# Patient Record
Sex: Male | Born: 1985 | Race: Black or African American | Hispanic: No | Marital: Single | Smoking: Never smoker
Health system: Southern US, Community
[De-identification: ages and names within clinical notes are randomized; demographics above are authoritative.]

## PROBLEM LIST (undated history)

## (undated) DIAGNOSIS — J40 Bronchitis, not specified as acute or chronic: Secondary | ICD-10-CM

## (undated) DIAGNOSIS — R079 Chest pain, unspecified: Secondary | ICD-10-CM

## (undated) DIAGNOSIS — IMO0002 Reserved for concepts with insufficient information to code with codable children: Secondary | ICD-10-CM

## (undated) HISTORY — PX: LEG SURGERY: SHX1003

## (undated) HISTORY — DX: Chest pain, unspecified: R07.9

---
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
## 2005-05-18 ENCOUNTER — Emergency Department (HOSPITAL_COMMUNITY): Admission: EM | Admit: 2005-05-18 | Discharge: 2005-05-18 | Payer: Self-pay | Admitting: *Deleted

## 2007-02-06 ENCOUNTER — Emergency Department (HOSPITAL_COMMUNITY): Admission: EM | Admit: 2007-02-06 | Discharge: 2007-02-06 | Payer: Self-pay | Admitting: Emergency Medicine

## 2007-04-24 ENCOUNTER — Emergency Department (HOSPITAL_COMMUNITY): Admission: EM | Admit: 2007-04-24 | Discharge: 2007-04-24 | Payer: Self-pay | Admitting: Emergency Medicine

## 2008-03-13 ENCOUNTER — Emergency Department (HOSPITAL_COMMUNITY): Admission: EM | Admit: 2008-03-13 | Discharge: 2008-03-13 | Payer: Self-pay | Admitting: Emergency Medicine

## 2009-01-11 ENCOUNTER — Emergency Department (HOSPITAL_COMMUNITY): Admission: EM | Admit: 2009-01-11 | Discharge: 2009-01-11 | Payer: Self-pay | Admitting: Emergency Medicine

## 2010-06-10 ENCOUNTER — Emergency Department (HOSPITAL_COMMUNITY): Admission: EM | Admit: 2010-06-10 | Discharge: 2010-06-10 | Payer: Self-pay | Admitting: Family Medicine

## 2010-07-01 ENCOUNTER — Emergency Department (HOSPITAL_COMMUNITY): Admission: EM | Admit: 2010-07-01 | Discharge: 2010-07-01 | Payer: Self-pay | Admitting: Family Medicine

## 2010-07-14 ENCOUNTER — Encounter
Admission: RE | Admit: 2010-07-14 | Discharge: 2010-08-19 | Payer: Self-pay | Source: Home / Self Care | Attending: Sports Medicine | Admitting: Sports Medicine

## 2010-08-25 ENCOUNTER — Encounter
Admission: RE | Admit: 2010-08-25 | Discharge: 2010-09-16 | Payer: Self-pay | Source: Home / Self Care | Attending: Sports Medicine | Admitting: Sports Medicine

## 2010-08-27 ENCOUNTER — Encounter: Admit: 2010-08-27 | Payer: Self-pay | Admitting: Sports Medicine

## 2010-09-01 ENCOUNTER — Encounter: Admit: 2010-09-01 | Payer: Self-pay | Admitting: Sports Medicine

## 2010-12-02 LAB — POCT RAPID STREP A (OFFICE): Streptococcus, Group A Screen (Direct): NEGATIVE

## 2011-01-09 NOTE — Op Note (Signed)
NAME:  [name], [name] NO.:  [id number]   MEDICAL RECORD NO.:  [id number]          PATIENT TYPE:  AMB   LOCATION:  DSC                          FACILITY:  [facility]   PHYSICIAN:  Doralee Albino. Carola Frost, M.D. DATE OF BIRTH:  03-20-1986   DATE OF PROCEDURE:  06/09/2004  DATE OF DISCHARGE:                                 OPERATIVE REPORT   PREOPERATIVE DIAGNOSIS:  Right leg exertional compartment syndrome.   POSTOPERATIVE DIAGNOSIS:  Right leg exertional compartment syndrome.   OPERATION PERFORMED:  Right leg anterior and lateral compartment releases.   SURGEON:  Doralee Albino. Carola Frost, M.D.   ASSISTANT:  Cecil Cranker, PA   ANESTHESIA:  General.   COMPLICATIONS:  None.   ESTIMATED BLOOD LOSS:  Scant.   TOURNIQUET TIME:  None.   DRAINS:  None.   DISPOSITION:  To PACU condition stable.   INDICATIONS FOR PROCEDURE:  [name] is an 25 year old [place]  college basketball player and student who underwent work-up for right lower  extremity pain with activity and included MRI which was negative for stress  fracture and measurement of compartment pressures after exercise noted  increases up to 56 in the anterior compartment and 47 in the lateral  compartment from a baseline of 15 in both compartments.  This was well  within 30 mmHg of the diastolic pressure.  He was then explained the risks  and benefits of surgery along with his mother and the patient and his mother  elected to proceed with release.   DESCRIPTION OF PROCEDURE:  Paul Yates was taken to the operating room and general  anesthesia induced.  He then had his right lower extremity prepped and  draped in the usual sterile fashion.  A 4 cm incision was then made over the  anterolateral aspect of the leg in the middle between the proximal and  distal fibula.  Dissection was carried down to the fascia and the  intermuscular septum identified.  The perforating vein and subcutaneous  tissue was then  cauterized and cut.  This exposed the anterior compartment  and the lateral compartment.  Longitudinal slits were made in both  compartments and scissors with tips point anteriorly were then used to split  the anterior compartment with pointed anteriorly and the lateral compartment  with the scissor tips pointed posteriorly to avoid any injury to the  superficial peroneal nerve.  Releases were confirmed with digital palpation.  The wound was irrigated and then closed in standard layered fashion with 2-0  Vicryl and 4-0 Monocryl.  Steri-Strips were applied.  The patient was placed  into a gently compressive dressing, awakened from anesthesia and transported  to the post anesthesia care unit in stable condition.   PROGNOSIS:  We anticipate that Mr. Sweet will require some crutches in the  early going as he recovers from his incisional pain and fascial releases.  He will return to the clinic in 10 days for wound inspection and progressive  weightbearing and activities.  Postoperatively we will assess his  superficial peroneal nerve.  He will be provided Percocet for pain control.  [facility]/MEDQ  D:  06/09/2004  T:  06/09/2004  Job:  [id number]

## 2011-03-06 ENCOUNTER — Encounter: Payer: Self-pay | Admitting: *Deleted

## 2011-03-06 ENCOUNTER — Emergency Department (HOSPITAL_BASED_OUTPATIENT_CLINIC_OR_DEPARTMENT_OTHER)
Admission: EM | Admit: 2011-03-06 | Discharge: 2011-03-06 | Disposition: A | Discharge status: Home / Self Care | Payer: Self-pay | Attending: Emergency Medicine | Admitting: Emergency Medicine

## 2011-03-06 DIAGNOSIS — R112 Nausea with vomiting, unspecified: Secondary | ICD-10-CM | POA: Insufficient documentation

## 2011-03-06 DIAGNOSIS — R51 Headache: Secondary | ICD-10-CM | POA: Insufficient documentation

## 2011-03-06 DIAGNOSIS — R197 Diarrhea, unspecified: Secondary | ICD-10-CM | POA: Insufficient documentation

## 2011-03-06 DIAGNOSIS — K5289 Other specified noninfective gastroenteritis and colitis: Secondary | ICD-10-CM | POA: Insufficient documentation

## 2011-03-06 HISTORY — DX: Reserved for concepts with insufficient information to code with codable children: IMO0002

## 2011-03-06 LAB — COMPREHENSIVE METABOLIC PANEL
Albumin: 5.2 g/dL (ref 3.5–5.2)
Alkaline Phosphatase: 72 U/L (ref 39–117)
BUN: 19 mg/dL (ref 6–23)
CO2: 27 mEq/L (ref 19–32)
Chloride: 96 mEq/L (ref 96–112)
Glucose, Bld: 129 mg/dL — ABNORMAL HIGH (ref 70–99)
Potassium: 3.7 mEq/L (ref 3.5–5.1)
Sodium: 139 mEq/L (ref 135–145)
Total Bilirubin: 0.5 mg/dL (ref 0.3–1.2)

## 2011-03-06 LAB — URINALYSIS, ROUTINE W REFLEX MICROSCOPIC
Hgb urine dipstick: NEGATIVE
Ketones, ur: >80 mg/dL — AB
Leukocytes, UA: NEGATIVE
Nitrite: NEGATIVE
Urobilinogen, UA: 0.2 mg/dL (ref 0.0–1.0)

## 2011-03-06 LAB — DIFFERENTIAL
Eosinophils Absolute: 0 10*3/uL (ref 0.0–0.7)
Lymphocytes Relative: 21 % (ref 12–46)
Neutrophils Relative %: 73 % (ref 43–77)

## 2011-03-06 LAB — CBC
HCT: 43.9 % (ref 39.0–52.0)
Hemoglobin: 15.1 g/dL (ref 13.0–17.0)
MCH: 31.1 pg (ref 26.0–34.0)
MCV: 90.5 fL (ref 78.0–100.0)
RBC: 4.85 MIL/uL (ref 4.22–5.81)
RDW: 12.3 % (ref 11.5–15.5)

## 2011-03-06 MED ORDER — DIPHENOXYLATE-ATROPINE 2.5-0.025 MG PO TABS: 2.0000 | ORAL_TABLET | Freq: Four times a day (QID) | ORAL | Status: AC | PRN

## 2011-03-06 MED ORDER — SODIUM CHLORIDE 0.9 % IV SOLN
999.0000 mL | Freq: Once | INTRAVENOUS | Status: AC
  Administered 2011-03-06: 1000 mL via INTRAVENOUS

## 2011-03-06 MED ORDER — OXYCODONE-ACETAMINOPHEN 5-325 MG PO TABS: 1.0000 | ORAL_TABLET | Freq: Four times a day (QID) | ORAL | Status: DC | PRN

## 2011-03-06 MED ORDER — OXYCODONE HCL 10 MG PO TB12: 10.0000 mg | ORAL_TABLET | Freq: Two times a day (BID) | ORAL | Status: DC

## 2011-03-06 MED ORDER — ACETAMINOPHEN 500 MG PO TABS
1000.0000 mg | ORAL_TABLET | Freq: Once | ORAL | Status: AC
  Administered 2011-03-06: 1000 mg via ORAL

## 2011-03-06 MED ORDER — ONDANSETRON HCL 4 MG/2ML IJ SOLN
INTRAMUSCULAR | Status: AC
  Filled 2011-03-06: qty 2

## 2011-03-06 MED ORDER — DIPHENOXYLATE-ATROPINE 2.5-0.025 MG PO TABS
2.0000 | ORAL_TABLET | Freq: Once | ORAL | Status: AC
  Administered 2011-03-06: 2 via ORAL

## 2011-03-06 MED ORDER — ONDANSETRON HCL 4 MG/2ML IJ SOLN
4.0000 mg | Freq: Once | INTRAMUSCULAR | Status: AC
  Administered 2011-03-06: 4 mg via INTRAVENOUS

## 2011-03-06 MED ORDER — ONDANSETRON HCL 8 MG PO TABS: 8.0000 mg | ORAL_TABLET | Freq: Three times a day (TID) | ORAL | Status: AC | PRN

## 2011-03-06 MED ORDER — DIPHENOXYLATE-ATROPINE 2.5-0.025 MG PO TABS
ORAL_TABLET | ORAL | Status: AC
  Filled 2011-03-06: qty 2

## 2011-03-06 MED ORDER — ACETAMINOPHEN 500 MG PO TABS
ORAL_TABLET | ORAL | Status: AC
  Administered 2011-03-06: 1000 mg via ORAL
  Filled 2011-03-06: qty 2

## 2011-03-06 NOTE — ED Provider Notes (Addendum)
History     Chief Complaint  Patient presents with  . Emesis   Patient is a 25 y.o. male presenting with vomiting. The history is provided by the patient.  Emesis  The current episode started 3 to 5 hours ago. The problem has been gradually improving. The emesis has an appearance of stomach contents. Associated symptoms include diarrhea and headaches. Pertinent negatives include no abdominal pain.    Past Medical History  Diagnosis Date  . Bulging disc     Past Surgical History  Procedure Date  . Leg surgery     History reviewed. No pertinent family history.  History  Substance Use Topics  . Smoking status: Never Smoker   . Smokeless tobacco: Not on file  . Alcohol Use: No      Review of Systems  Gastrointestinal: Positive for nausea, vomiting and diarrhea. Negative for heartburn, abdominal pain and constipation.  Neurological: Positive for headaches.  All other systems reviewed and are negative.    Physical Exam  BP 124/71  Pulse 70  Temp(Src) 97.5 F (36.4 C) (Oral)  Resp 16  SpO2 100%  Physical Exam  Nursing note and vitals reviewed. Constitutional: He appears well-developed and well-nourished.  HENT:  Head: Normocephalic and atraumatic.  Eyes: EOM are normal. Pupils are equal, round, and reactive to light.  Neck: Normal range of motion. Neck supple.  Cardiovascular: Normal rate and regular rhythm.   Pulmonary/Chest: Effort normal and breath sounds normal.  Abdominal: Soft. Bowel sounds are normal. He exhibits no distension. There is no tenderness.  Musculoskeletal: Normal range of motion.  Neurological: He is alert.  Skin: Skin is warm and dry.    ED Course  Procedures  MDM  Summary of this visit's results, reviewed by myself:  Results for orders placed during the hospital encounter of 03/06/11  CBC      Component Value Range   WBC 7.4  4.0 - 10.5 (K/uL)   RBC 4.85  4.22 - 5.81 (MIL/uL)   Hemoglobin 15.1  13.0 - 17.0 (g/dL)   HCT 04.5   40.9 - 81.1 (%)   MCV 90.5  78.0 - 100.0 (fL)   MCH 31.1  26.0 - 34.0 (pg)   MCHC 34.4  30.0 - 36.0 (g/dL)   RDW 91.4  78.2 - 95.6 (%)   Platelets 144 (*) 150 - 400 (K/uL)  DIFFERENTIAL      Component Value Range   Neutrophils Relative 73  43 - 77 (%)   Neutro Abs 5.3  1.7 - 7.7 (K/uL)   Lymphocytes Relative 21  12 - 46 (%)   Lymphs Abs 1.6  0.7 - 4.0 (K/uL)   Monocytes Relative 6  3 - 12 (%)   Monocytes Absolute 0.4  0.1 - 1.0 (K/uL)   Eosinophils Relative 0  0 - 5 (%)   Eosinophils Absolute 0.0  0.0 - 0.7 (K/uL)   Basophils Relative 0  0 - 1 (%)   Basophils Absolute 0.0  0.0 - 0.1 (K/uL)  COMPREHENSIVE METABOLIC PANEL      Component Value Range   Sodium 139  135 - 145 (mEq/L)   Potassium 3.7  3.5 - 5.1 (mEq/L)   Chloride 96  96 - 112 (mEq/L)   CO2 27  19 - 32 (mEq/L)   Glucose, Bld 129 (*) 70 - 99 (mg/dL)   BUN 19  6 - 23 (mg/dL)   Creatinine, Ser 2.13  0.50 - 1.35 (mg/dL)   Calcium 08.6 (*) 8.4 - 10.5 (  mg/dL)   Total Protein 8.4 (*) 6.0 - 8.3 (g/dL)   Albumin 5.2  3.5 - 5.2 (g/dL)   AST 51 (*) 0 - 37 (U/L)   ALT 61 (*) 0 - 53 (U/L)   Alkaline Phosphatase 72  39 - 117 (U/L)   Total Bilirubin 0.5  0.3 - 1.2 (mg/dL)   GFR calc non Af Amer >60  >60 (mL/min)   GFR calc Af Amer >60  >60 (mL/min)  URINALYSIS, ROUTINE W REFLEX MICROSCOPIC      Component Value Range   Color, Urine YELLOW  YELLOW    Appearance CLEAR  CLEAR    Specific Gravity, Urine 1.025  1.005 - 1.030    pH 5.5  5.0 - 8.0    Glucose, UA NEGATIVE  NEGATIVE (mg/dL)   Hgb urine dipstick NEGATIVE  NEGATIVE    Bilirubin Urine NEGATIVE  NEGATIVE    Ketones >80 (*) NEGATIVE (mg/dL)   Protein NEGATIVE  NEGATIVE (mg/dL)   Urobilinogen, UA 0.2  0.0 - 1.0 (mg/dL)   Nitrite NEGATIVE  NEGATIVE    Leukocytes, UA NEGATIVE  NEGATIVE    3:29 AM Drinking fluids without emesis; feels better.         Hanley Seamen, MD 03/06/11 0243  Hanley Seamen, MD 03/06/11 4540  Hanley Seamen, MD 03/06/11 [id number]

## 2011-03-06 NOTE — ED Notes (Signed)
Pt c/o N/V/D and HA x1.5 hours.

## 2011-06-05 LAB — RAPID STREP SCREEN (MED CTR MEBANE ONLY): Streptococcus, Group A Screen (Direct): NEGATIVE

## 2011-08-03 ENCOUNTER — Other Ambulatory Visit: Payer: Self-pay | Admitting: Internal Medicine

## 2011-08-03 ENCOUNTER — Ambulatory Visit
Admission: RE | Admit: 2011-08-03 | Discharge: 2011-08-03 | Disposition: A | Discharge status: Home / Self Care | Payer: Self-pay | Source: Ambulatory Visit | Attending: Internal Medicine | Admitting: Internal Medicine

## 2011-08-03 DIAGNOSIS — R52 Pain, unspecified: Secondary | ICD-10-CM

## 2011-09-10 ENCOUNTER — Ambulatory Visit: Payer: Self-pay | Admitting: Internal Medicine

## 2011-09-11 ENCOUNTER — Ambulatory Visit: Payer: Self-pay | Admitting: Internal Medicine

## 2011-09-14 ENCOUNTER — Ambulatory Visit: Payer: Self-pay | Admitting: Internal Medicine

## 2011-10-05 ENCOUNTER — Ambulatory Visit (INDEPENDENT_AMBULATORY_CARE_PROVIDER_SITE_OTHER): Payer: Self-pay | Admitting: Internal Medicine

## 2011-10-05 ENCOUNTER — Encounter: Payer: Self-pay | Admitting: Internal Medicine

## 2011-10-05 VITALS — BP 120/90 | HR 62 | Temp 98.2°F | Ht 71.0 in | Wt 145.0 lb

## 2011-10-05 DIAGNOSIS — M549 Dorsalgia, unspecified: Secondary | ICD-10-CM

## 2011-10-05 MED ORDER — ZOLPIDEM TARTRATE 5 MG PO TABS: 5.0000 mg | ORAL_TABLET | Freq: Every evening | ORAL | Status: DC | PRN

## 2011-10-05 NOTE — Patient Instructions (Signed)
It is important that you exercise regularly, at least 20 minutes 3 to 4 times per week.  If you develop chest pain or shortness of breath seek  medical attention.  Call or return to clinic prn if these symptoms worsen or fail to improve as anticipated.  Continue ibuprofen as directed
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
## 2011-11-19 ENCOUNTER — Encounter: Payer: Self-pay | Admitting: Internal Medicine

## 2011-11-19 ENCOUNTER — Ambulatory Visit (INDEPENDENT_AMBULATORY_CARE_PROVIDER_SITE_OTHER): Payer: Self-pay | Admitting: Internal Medicine

## 2011-11-19 VITALS — BP 120/82 | Wt 148.0 lb

## 2011-11-19 DIAGNOSIS — M549 Dorsalgia, unspecified: Secondary | ICD-10-CM
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
## 2011-11-19 NOTE — Patient Instructions (Addendum)
Physical therapy as discussed  Call or return to clinic prn if these symptoms worsen or fail to improve as anticipated.   Followup Dr. Sheryle Hail if unimproved

## 2011-11-24 ENCOUNTER — Ambulatory Visit: Payer: Self-pay | Attending: Internal Medicine

## 2011-11-24 DIAGNOSIS — M542 Cervicalgia: Secondary | ICD-10-CM | POA: Insufficient documentation

## 2011-11-24 DIAGNOSIS — IMO0001 Reserved for inherently not codable concepts without codable children: Secondary | ICD-10-CM | POA: Insufficient documentation

## 2011-11-24 DIAGNOSIS — M6281 Muscle weakness (generalized): Secondary | ICD-10-CM | POA: Insufficient documentation

## 2011-11-24 DIAGNOSIS — M545 Low back pain, unspecified: Secondary | ICD-10-CM | POA: Insufficient documentation

## 2011-11-27 ENCOUNTER — Ambulatory Visit: Payer: Self-pay

## 2011-11-30 ENCOUNTER — Encounter: Payer: Self-pay | Admitting: Physical Therapy

## 2011-12-04 ENCOUNTER — Ambulatory Visit: Payer: Self-pay | Admitting: Physical Therapy

## 2011-12-07 ENCOUNTER — Ambulatory Visit: Payer: Self-pay | Admitting: Physical Therapy

## 2011-12-11 ENCOUNTER — Other Ambulatory Visit: Payer: Self-pay

## 2011-12-11 ENCOUNTER — Ambulatory Visit: Payer: Self-pay | Admitting: Physical Therapy

## 2011-12-11 MED ORDER — ZOLPIDEM TARTRATE 5 MG PO TABS: 5.0000 mg | ORAL_TABLET | Freq: Every evening | ORAL | Status: DC | PRN

## 2011-12-16 ENCOUNTER — Ambulatory Visit: Payer: Self-pay | Admitting: Physical Therapy

## 2011-12-18 ENCOUNTER — Encounter: Payer: Self-pay | Admitting: Physical Therapy

## 2011-12-21 ENCOUNTER — Encounter: Payer: Self-pay | Admitting: Physical Therapy

## 2011-12-23 ENCOUNTER — Ambulatory Visit: Payer: Self-pay | Attending: Internal Medicine | Admitting: Physical Therapy

## 2011-12-23 DIAGNOSIS — M545 Low back pain, unspecified: Secondary | ICD-10-CM | POA: Insufficient documentation

## 2011-12-23 DIAGNOSIS — M542 Cervicalgia: Secondary | ICD-10-CM | POA: Insufficient documentation

## 2011-12-23 DIAGNOSIS — M6281 Muscle weakness (generalized): Secondary | ICD-10-CM | POA: Insufficient documentation

## 2011-12-23 DIAGNOSIS — IMO0001 Reserved for inherently not codable concepts without codable children: Secondary | ICD-10-CM | POA: Insufficient documentation

## 2011-12-25 ENCOUNTER — Ambulatory Visit: Payer: Self-pay | Admitting: Physical Therapy

## 2011-12-30 ENCOUNTER — Ambulatory Visit: Payer: Self-pay | Admitting: Internal Medicine

## 2011-12-31 ENCOUNTER — Encounter: Payer: Self-pay | Admitting: Internal Medicine

## 2011-12-31 ENCOUNTER — Ambulatory Visit (INDEPENDENT_AMBULATORY_CARE_PROVIDER_SITE_OTHER): Payer: Self-pay | Admitting: Internal Medicine

## 2011-12-31 VITALS — BP 100/64 | Wt 146.0 lb

## 2011-12-31 DIAGNOSIS — S161XXA Strain of muscle, fascia and tendon at neck level, initial encounter: Secondary | ICD-10-CM

## 2011-12-31 DIAGNOSIS — S139XXA Sprain of joints and ligaments of unspecified parts of neck, initial encounter: Secondary | ICD-10-CM

## 2011-12-31 MED ORDER — MELOXICAM 15 MG PO TABS: 15.0000 mg | ORAL_TABLET | Freq: Every day | ORAL | Status: AC

## 2011-12-31 MED ORDER — TRAMADOL HCL 50 MG PO TABS: 50.0000 mg | ORAL_TABLET | ORAL | Status: DC | PRN

## 2011-12-31 MED ORDER — CYCLOBENZAPRINE HCL 10 MG PO TABS: 10.0000 mg | ORAL_TABLET | Freq: Three times a day (TID) | ORAL | Status: DC | PRN
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
## 2011-12-31 NOTE — Patient Instructions (Signed)
Orthopedic evaluation as discussed  Cervical Sprain A cervical sprain is an injury in the neck in which the ligaments are stretched or torn. The ligaments are the tissues that hold the bones of the neck (vertebrae) in place. Cervical sprains can range from very mild to very severe. Most cervical sprains get better in 1 to 3 weeks, but it depends on the cause and extent of the injury. Severe cervical sprains can cause the neck vertebrae to be unstable. This can lead to damage of the spinal cord and can result in serious nervous system problems. Your caregiver will determine whether your cervical sprain is mild or severe. CAUSES   Severe cervical sprains may be caused by:  Contact sport injuries (football, rugby, wrestling, hockey, auto racing, gymnastics, diving, martial arts, boxing).   Motor vehicle collisions.   Whiplash injuries. This means the neck is forcefully whipped backward and forward.   Falls.  Mild cervical sprains may be caused by:    Awkward positions, such as cradling a telephone between your ear and shoulder.   Sitting in a chair that does not offer proper support.   Working at a poorly Marketing executive station.   Activities that require looking up or down for long periods of time.  SYMPTOMS    Pain, soreness, stiffness, or a burning sensation in the front, back, or sides of the neck. This discomfort may develop immediately after injury or it may develop slowly and not begin for 24 hours or more after an injury.   Pain or tenderness directly in the middle of the back of the neck.   Shoulder or upper back pain.   Limited ability to move the neck.   Headache.   Dizziness.   Weakness, numbness, or tingling in the hands or arms.   Muscle spasms.   Difficulty swallowing or chewing.   Tenderness and swelling of the neck.  DIAGNOSIS   Most of the time, your caregiver can diagnose this problem by taking your history and doing a physical exam. Your caregiver will  ask about any known problems, such as arthritis in the neck or a previous neck injury. X-rays may be taken to find out if there are any other problems, such as problems with the bones of the neck. However, an X-ray often does not reveal the full extent of a cervical sprain. Other tests such as a computed tomography (CT) scan or magnetic resonance imaging (MRI) may be needed. TREATMENT   Treatment depends on the severity of the cervical sprain. Mild sprains can be treated with rest, keeping the neck in place (immobilization), and pain medicines. Severe cervical sprains need immediate immobilization and an appointment with an orthopedist or neurosurgeon. Several treatment options are available to help with pain, muscle spasms, and other symptoms. Your caregiver may prescribe:  Medicines, such as pain relievers, numbing medicines, or muscle relaxants.   Physical therapy. This can include stretching exercises, strengthening exercises, and posture training. Exercises and improved posture can help stabilize the neck, strengthen muscles, and help stop symptoms from returning.   A neck collar to be worn for short periods of time. Often, these collars are worn for comfort. However, certain collars may be worn to protect the neck and prevent further worsening of a serious cervical sprain.  HOME CARE INSTRUCTIONS    Put ice on the injured area.   Put ice in a plastic bag.   Place a towel between your skin and the bag.   Leave the ice on for  15 to 20 minutes, 3 to 4 times a day.   Only take over-the-counter or prescription medicines for pain, discomfort, or fever as directed by your caregiver.   Keep all follow-up appointments as directed by your caregiver.   Keep all physical therapy appointments as directed by your caregiver.   If a neck collar is prescribed, wear it as directed by your caregiver.   Do not drive while wearing a neck collar.   Make any needed adjustments to your work station to  promote good posture.   Avoid positions and activities that make your symptoms worse.   Warm up and stretch before being active to help prevent problems.  SEEK MEDICAL CARE IF:    Your pain is not controlled with medicine.   You are unable to decrease your pain medicine over time as planned.   Your activity level is not improving as expected.  SEEK IMMEDIATE MEDICAL CARE IF:    You develop any bleeding, stomach upset, or signs of an allergic reaction to your medicine.   Your symptoms get worse.   You develop new, unexplained symptoms.   You have numbness, tingling, weakness, or paralysis in any part of your body.  MAKE SURE YOU:    Understand these instructions.   Will watch your condition.   Will get help right away if you are not doing well or get worse.  Document Released: 06/07/2007 Document Revised: 07/30/2011 Document Reviewed: 05/13/2011 [facility] Patient Information 2012 [place], [place].Cervical Strain and Sprain (Whiplash) with Rehab Cervical strain and sprains are injuries that commonly occur with "whiplash" injuries. Whiplash occurs when the neck is forcefully whipped backward or forward, such as during a motor vehicle accident. The muscles, ligaments, tendons, discs and nerves of the neck are susceptible to injury when this occurs. SYMPTOMS    Pain or stiffness in the front and/or back of neck   Symptoms may present immediately or up to 24 hours after injury.   Dizziness, headache, nausea and vomiting.   Muscle spasm with soreness and stiffness in the neck.   Tenderness and swelling at the injury site.  CAUSES   Whiplash injuries often occur during contact sports or motor vehicle accidents.   RISK INCREASES WITH:  Osteoarthritis of the spine.   Situations that make head or neck accidents or trauma more likely.   High-risk sports (football, rugby, wrestling, hockey, auto racing, gymnastics, diving, contact karate or boxing).   Poor strength and  flexibility of the neck.   Previous neck injury.   Poor tackling technique.   Improperly fitted or padded equipment.  PREVENTION  Learn and use proper technique (avoid tackling with the head, spearing and head-butting; use proper falling techniques to avoid landing on the head).   Warm up and stretch properly before activity.   Maintain physical fitness:   Strength, flexibility and endurance.   Cardiovascular fitness.   Wear properly fitted and padded protective equipment, such as padded soft collars, for participation in contact sports.  PROGNOSIS   Recovery for cervical strain and sprain injuries is dependent on the extent of the injury. These injuries are usually curable in 1 week to 3 months with appropriate treatment.   RELATED COMPLICATIONS    Temporary numbness and weakness may occur if the nerve roots are damaged, and this may persist until the nerve has completely healed.   Chronic pain due to frequent recurrence of symptoms.   Prolonged healing, especially if activity is resumed too soon (before complete recovery).  TREATMENT  Treatment initially involves the use of ice and medication to help reduce pain and inflammation. It is also important to perform strengthening and stretching exercises and modify activities that worsen symptoms so the injury does not get worse. These exercises may be performed at home or with a therapist. For patients who experience severe symptoms, a soft padded collar may be recommended to be worn around the neck.   Improving your posture may help reduce symptoms. Posture improvement includes pulling your chin and abdomen in while sitting or standing. If you are sitting, sit in a firm chair with your buttocks against the back of the chair. While sleeping, try replacing your pillow with a small towel rolled to 2 inches in diameter, or use a cervical pillow or soft cervical collar. Poor sleeping positions delay healing.   For patients with nerve root  damage, which causes numbness or weakness, the use of a cervical traction apparatus may be recommended. Surgery is rarely necessary for these injuries. However, cervical strain and sprains that are present at birth (congenital) may require surgery. MEDICATION    If pain medication is necessary, nonsteroidal anti-inflammatory medications, such as aspirin and ibuprofen, or other minor pain relievers, such as acetaminophen, are often recommended.   Do not take pain medication for 7 days before surgery.   Prescription pain relievers may be given if deemed necessary by your caregiver. Use only as directed and only as much as you need.  HEAT AND COLD:    Cold treatment (icing) relieves pain and reduces inflammation. Cold treatment should be applied for 10 to 15 minutes every 2 to 3 hours for inflammation and pain and immediately after any activity that aggravates your symptoms. Use ice packs or an ice massage.   Heat treatment may be used prior to performing the stretching and strengthening activities prescribed by your caregiver, physical therapist, or athletic trainer. Use a heat pack or a warm soak.  SEEK MEDICAL CARE IF:    Symptoms get worse or do not improve in 2 weeks despite treatment.   New, unexplained symptoms develop (drugs used in treatment may produce side effects).  EXERCISES RANGE OF MOTION (ROM) AND STRETCHING EXERCISES - Cervical Strain and Sprain These exercises may help you when beginning to rehabilitate your injury. In order to successfully resolve your symptoms, you must improve your posture. These exercises are designed to help reduce the forward-head and rounded-shoulder posture which contributes to this condition. Your symptoms may resolve with or without further involvement from your physician, physical therapist or athletic trainer. While completing these exercises, remember:    Restoring tissue flexibility helps normal motion to return to the joints. This allows healthier,  less painful movement and activity.   An effective stretch should be held for at least 20 seconds, although you may need to begin with shorter hold times for comfort.   A stretch should never be painful. You should only feel a gentle lengthening or release in the stretched tissue.  STRETCH- Axial Extensors  Lie on your back on the floor. You may bend your knees for comfort. Place a rolled up hand towel or dish towel, about 2 inches in diameter, under the part of your head that makes contact with the floor.   Gently tuck your chin, as if trying to make a "double chin," until you feel a gentle stretch at the base of your head.   Hold __________ seconds.  Repeat __________ times. Complete this exercise __________ times per day.   STRETECH -  Axial Extension   Stand or sit on a firm surface. Assume a good posture: chest up, shoulders drawn back, abdominal muscles slightly tense, knees unlocked (if standing) and feet hip width apart.   Slowly retract your chin so your head slides back and your chin slightly lowers.Continue to look straight ahead.   You should feel a gentle stretch in the back of your head. Be certain not to feel an aggressive stretch since this can cause headaches later.   Hold for __________ seconds.  Repeat __________ times. Complete this exercise __________ times per day. STRETCH - Cervical Side Bend   Stand or sit on a firm surface. Assume a good posture: chest up, shoulders drawn back, abdominal muscles slightly tense, knees unlocked (if standing) and feet hip width apart.   Without letting your nose or shoulders move, slowly tip your right / left ear to your shoulder until your feel a gentle stretch in the muscles on the opposite side of your neck.   Hold __________ seconds.  Repeat __________ times. Complete this exercise __________ times per day. STRETCH - Cervical Rotators   Stand or sit on a firm surface. Assume a good posture: chest up, shoulders drawn back,  abdominal muscles slightly tense, knees unlocked (if standing) and feet hip width apart.   Keeping your eyes level with the ground, slowly turn your head until you feel a gentle stretch along the back and opposite side of your neck.   Hold __________ seconds.  Repeat __________ times. Complete this exercise __________ times per day. RANGE OF MOTION - Neck Circles   Stand or sit on a firm surface. Assume a good posture: chest up, shoulders drawn back, abdominal muscles slightly tense, knees unlocked (if standing) and feet hip width apart.   Gently roll your head down and around from the back of one shoulder to the back of the other. The motion should never be forced or painful.   Repeat the motion 10-20 times, or until you feel the neck muscles relax and loosen.  Repeat __________ times. Complete the exercise __________ times per day. STRENGTHENING EXERCISES - Cervical Strain and Sprain These exercises may help you when beginning to rehabilitate your injury. They may resolve your symptoms with or without further involvement from your physician, physical therapist or athletic trainer. While completing these exercises, remember:    Muscles can gain both the endurance and the strength needed for everyday activities through controlled exercises.   Complete these exercises as instructed by your physician, physical therapist or athletic trainer. Progress the resistance and repetitions only as guided.   You may experience muscle soreness or fatigue, but the pain or discomfort you are trying to eliminate should never worsen during these exercises. If this pain does worsen, stop and make certain you are following the directions exactly. If the pain is still present after adjustments, discontinue the exercise until you can discuss the trouble with your clinician.  STRENGTH - Cervical Flexors, Isometric  Face a wall, standing about 6 inches away. Place a small pillow, a ball about 6-8 inches in diameter,  or a folded towel between your forehead and the wall.   Slightly tuck your chin and gently push your forehead into the soft object. Push only with mild to moderate intensity, building up tension gradually. Keep your jaw and forehead relaxed.   Hold 10 to 20 seconds. Keep your breathing relaxed.   Release the tension slowly. Relax your neck muscles completely before you start the next repetition.  Repeat __________ times. Complete this exercise __________ times per day. STRENGTH- Cervical Lateral Flexors, Isometric   Stand about 6 inches away from a wall. Place a small pillow, a ball about 6-8 inches in diameter, or a folded towel between the side of your head and the wall.   Slightly tuck your chin and gently tilt your head into the soft object. Push only with mild to moderate intensity, building up tension gradually. Keep your jaw and forehead relaxed.   Hold 10 to 20 seconds. Keep your breathing relaxed.   Release the tension slowly. Relax your neck muscles completely before you start the next repetition.  Repeat __________ times. Complete this exercise __________ times per day. STRENGTH - Cervical Extensors, Isometric   Stand about 6 inches away from a wall. Place a small pillow, a ball about 6-8 inches in diameter, or a folded towel between the back of your head and the wall.   Slightly tuck your chin and gently tilt your head back into the soft object. Push only with mild to moderate intensity, building up tension gradually. Keep your jaw and forehead relaxed.   Hold 10 to 20 seconds. Keep your breathing relaxed.   Release the tension slowly. Relax your neck muscles completely before you start the next repetition.  Repeat __________ times. Complete this exercise __________ times per day. POSTURE AND BODY MECHANICS CONSIDERATIONS - Cervical Strain and Sprain Keeping correct posture when sitting, standing or completing your activities will reduce the stress put on different body  tissues, allowing injured tissues a chance to heal and limiting painful experiences. The following are general guidelines for improved posture. Your physician or physical therapist will provide you with any instructions specific to your needs. While reading these guidelines, remember:  The exercises prescribed by your provider will help you have the flexibility and strength to maintain correct postures.   The correct posture provides the optimal environment for your joints to work. All of your joints have less wear and tear when properly supported by a spine with good posture. This means you will experience a healthier, less painful body.   Correct posture must be practiced with all of your activities, especially prolonged sitting and standing. Correct posture is as important when doing repetitive low-stress activities (typing) as it is when doing a single heavy-load activity (lifting).  PROLONGED STANDING WHILE SLIGHTLY LEANING FORWARD When completing a task that requires you to lean forward while standing in one place for a long time, place either foot up on a stationary 2-4 inch high object to help maintain the best posture. When both feet are on the ground, the low back tends to lose its slight inward curve. If this curve flattens (or becomes too large), then the back and your other joints will experience too much stress, fatigue more quickly and can cause pain.   RESTING POSITIONS Consider which positions are most painful for you when choosing a resting position. If you have pain with flexion-based activities (sitting, bending, stooping, squatting), choose a position that allows you to rest in a less flexed posture. You would want to avoid curling into a fetal position on your side. If your pain worsens with extension-based activities (prolonged standing, working overhead), avoid resting in an extended position such as sleeping on your stomach. Most people will find more comfort when they rest with  their spine in a more neutral position, neither too rounded nor too arched. Lying on a non-sagging bed on your side with a pillow between your  knees, or on your back with a pillow under your knees will often provide some relief. Keep in mind, being in any one position for a prolonged period of time, no matter how correct your posture, can still lead to stiffness. WALKING Walk with an upright posture. Your ears, shoulders and hips should all line-up. OFFICE WORK When working at a desk, create an environment that supports good, upright posture. Without extra support, muscles fatigue and lead to excessive strain on joints and other tissues. CHAIR:  A chair should be able to slide under your desk when your back makes contact with the back of the chair. This allows you to work closely.   The chair's height should allow your eyes to be level with the upper part of your monitor and your hands to be slightly lower than your elbows.   Body position:   Your feet should make contact with the floor. If this is not possible, use a foot rest.   Keep your ears over your shoulders. This will reduce stress on your neck and low back.  Document Released: 08/10/2005 Document Revised: 07/30/2011 Document Reviewed: 11/22/2008 [facility] Patient Information 2012 [place], [place].

## 2012-01-01 ENCOUNTER — Telehealth: Payer: Self-pay | Admitting: Internal Medicine

## 2012-01-01 NOTE — Telephone Encounter (Signed)
Patient is requesting a note for work, as he was in on yesterday 12/31/11. Please assist.

## 2012-01-01 NOTE — Telephone Encounter (Signed)
Attempt to call - Vm - LMTCB if questions - note will be done and put up front for pick up . KIK

## 2012-01-15 ENCOUNTER — Other Ambulatory Visit: Payer: Self-pay

## 2012-01-15 MED ORDER — ZOLPIDEM TARTRATE 5 MG PO TABS: 5.0000 mg | ORAL_TABLET | Freq: Every evening | ORAL | Status: DC | PRN

## 2012-02-04 ENCOUNTER — Other Ambulatory Visit: Payer: Self-pay | Admitting: Orthopedic Surgery

## 2012-02-04 DIAGNOSIS — M542 Cervicalgia: Secondary | ICD-10-CM

## 2012-02-04 DIAGNOSIS — M545 Low back pain: Secondary | ICD-10-CM

## 2012-02-08 ENCOUNTER — Other Ambulatory Visit: Payer: Self-pay

## 2012-02-10 ENCOUNTER — Ambulatory Visit
Admission: RE | Admit: 2012-02-10 | Discharge: 2012-02-10 | Disposition: A | Discharge status: Home / Self Care | Payer: Self-pay | Source: Ambulatory Visit | Attending: Orthopedic Surgery | Admitting: Orthopedic Surgery

## 2012-02-10 DIAGNOSIS — M545 Low back pain: Secondary | ICD-10-CM

## 2012-02-10 DIAGNOSIS — M542 Cervicalgia: Secondary | ICD-10-CM

## 2012-03-21 ENCOUNTER — Ambulatory Visit (INDEPENDENT_AMBULATORY_CARE_PROVIDER_SITE_OTHER): Payer: Self-pay | Admitting: Internal Medicine

## 2012-03-21 ENCOUNTER — Encounter: Payer: Self-pay | Admitting: Internal Medicine

## 2012-03-21 VITALS — BP 110/70 | Wt 150.0 lb

## 2012-03-21 DIAGNOSIS — M549 Dorsalgia, unspecified: Secondary | ICD-10-CM

## 2012-03-21 MED ORDER — ZOLPIDEM TARTRATE 5 MG PO TABS: 5.0000 mg | ORAL_TABLET | Freq: Every evening | ORAL | Status: DC | PRN

## 2012-03-21 NOTE — Patient Instructions (Signed)
Orthopedic followup as scheduled  Return here as needed

## 2012-03-21 NOTE — Progress Notes (Signed)
  Subjective:    Patient ID: Paul Yates, male    DOB: 13-Jul-1986, 26 y.o.   MRN: [id number]  HPI  26 year old patient who is followed by orthopedics due to cervical and low back pain. Unfortunately he is about the same. He is considering further physical therapy and epidurals in the near future. His job unfortunately  consists of a 12 hour shifts.  He is using tramadol Mobic and occasional Flexeril.    Review of Systems  Musculoskeletal: Positive for back pain.       Objective:   Physical Exam  Constitutional: He appears well-developed and well-nourished. No distress.  Musculoskeletal:       Exam is unchanged straight leg test on the right does cause pain to the right hip and right thigh region  No motor weakness. Able to walk on his toes and heels  Absent right Achilles reflex unchanged          Assessment & Plan:   Cervical and lumbar pain. Followup orthopedics Medicines refilled;  return here when necessary

## 2012-03-30 ENCOUNTER — Telehealth: Payer: Self-pay | Admitting: Internal Medicine

## 2012-03-30 MED ORDER — HYDROCODONE-ACETAMINOPHEN 5-500 MG PO TABS: 1.0000 | ORAL_TABLET | Freq: Four times a day (QID) | ORAL | Status: AC | PRN

## 2012-03-30 NOTE — Telephone Encounter (Signed)
vicodin [phone]  #[phone]  One every 6 hrs prn; f/u ortho

## 2012-03-30 NOTE — Telephone Encounter (Signed)
Caller: Angela/Mother; PCP: Eleonore Chiquito; CB#: [phone]; ; ; Call regarding Back Pain; Mother reports that an MRI was done and did show some nerve damage. Patient was instructed to call back if pain is still present and Dr. Amador Cunas would call in pain medication. Patient has been taking Ambien for sleeping and Tramadol for pain. Mother reports that the Tramadol is not helping the pain. Patient reports pain across the back of shoulders and lower back. Mother reports that the patient is having numbness that is radiating down his left leg at this time. Patient reports the pain in the shoulder blades is a burning pain. Emergent symptom of "Back pain radiates into thigh or below knee" positive per Back Symptoms guideline. Mother reports that an orthopedic physician prescribed the pain medication to the patient. Attempted to make an appointment with the patient's primary doctor, but there were no appointments available today. Instructed the mother of the patient to call the orthopedic doctor to see about getting further treatment. Instructed mother to call back if orthopedic doctor not able to give assistance.

## 2012-03-30 NOTE — Telephone Encounter (Signed)
Mother aware - she is still trying to get appt with ortho - rx called to [facility]

## 2012-03-30 NOTE — Telephone Encounter (Signed)
Forward to inform 

## 2012-04-28 ENCOUNTER — Other Ambulatory Visit (HOSPITAL_COMMUNITY): Payer: Self-pay | Admitting: Orthopedic Surgery

## 2012-04-28 DIAGNOSIS — M542 Cervicalgia: Secondary | ICD-10-CM

## 2012-05-02 ENCOUNTER — Other Ambulatory Visit (HOSPITAL_COMMUNITY): Payer: Self-pay

## 2012-05-13 ENCOUNTER — Other Ambulatory Visit (HOSPITAL_COMMUNITY): Payer: Self-pay

## 2012-06-09 ENCOUNTER — Other Ambulatory Visit: Payer: Self-pay | Admitting: Internal Medicine

## 2012-07-15 ENCOUNTER — Other Ambulatory Visit: Payer: Self-pay | Admitting: Internal Medicine

## 2012-10-03 ENCOUNTER — Other Ambulatory Visit: Payer: Self-pay | Admitting: Internal Medicine

## 2012-12-01 ENCOUNTER — Other Ambulatory Visit: Payer: Self-pay | Admitting: Internal Medicine

## 2013-01-29 ENCOUNTER — Other Ambulatory Visit: Payer: Self-pay | Admitting: Internal Medicine

## 2013-01-31 NOTE — Telephone Encounter (Signed)
Pt 's mother following up on request for script zolpidem (AMBIEN) 5 MG tablet [facility] [address]

## 2013-03-29 ENCOUNTER — Other Ambulatory Visit: Payer: Self-pay | Admitting: Internal Medicine

## 2013-03-30 NOTE — Telephone Encounter (Signed)
Pt following up on rx request, is in a lot of back pain, no meds to take.

## 2013-03-31 ENCOUNTER — Other Ambulatory Visit: Payer: Self-pay | Admitting: Internal Medicine

## 2013-05-01 ENCOUNTER — Other Ambulatory Visit: Payer: Self-pay | Admitting: Internal Medicine

## 2013-05-04 ENCOUNTER — Other Ambulatory Visit: Payer: Self-pay | Admitting: Internal Medicine

## 2013-05-04 ENCOUNTER — Telehealth: Payer: Self-pay | Admitting: Internal Medicine

## 2013-05-04 MED ORDER — ZOLPIDEM TARTRATE 5 MG PO TABS: ORAL_TABLET | ORAL | Status: DC

## 2013-05-04 MED ORDER — TRAMADOL HCL 50 MG PO TABS: 50.0000 mg | ORAL_TABLET | Freq: Three times a day (TID) | ORAL | Status: DC | PRN

## 2013-05-04 NOTE — Telephone Encounter (Signed)
Spoke to pt's mother Marylene Land told her Rx's called into pharmacy. Marylene Land verbalized understanding and stated pt will make an appt with in the next 3 months has just started a new job and is waiting for insurance. Told her okay.

## 2013-05-04 NOTE — Telephone Encounter (Signed)
Mother called for pt's refill.  Tramadol Paul Yates (advised mother should be one refill on [place].)   He is living in [place] b/c he took a job @ [employer]. Pt works 8-5pm and doesn't feel like he can ask off work for at least 90 days more days.. Only been there 3 weeks. Pt does not have insurance either until 90 days. Mother would like to ask if we could refill for 90 days. Then [facility] appt [facility]  pt not seen since 03/21/12

## 2013-10-05 ENCOUNTER — Emergency Department (INDEPENDENT_AMBULATORY_CARE_PROVIDER_SITE_OTHER)
Admission: EM | Admit: 2013-10-05 | Discharge: 2013-10-05 | Disposition: A | Discharge status: Home / Self Care | Payer: Self-pay | Source: Home / Self Care

## 2013-10-05 ENCOUNTER — Encounter (HOSPITAL_COMMUNITY): Payer: Self-pay | Admitting: Emergency Medicine

## 2013-10-05 DIAGNOSIS — G44209 Tension-type headache, unspecified, not intractable: Secondary | ICD-10-CM

## 2013-10-05 MED ORDER — KETOROLAC TROMETHAMINE 60 MG/2ML IM SOLN
INTRAMUSCULAR | Status: AC
  Filled 2013-10-05: qty 2

## 2013-10-05 MED ORDER — BUTALBITAL-APAP-CAFFEINE 50-325-40 MG PO TABS: 1.0000 | ORAL_TABLET | Freq: Four times a day (QID) | ORAL | Status: DC | PRN

## 2013-10-05 MED ORDER — TRAMADOL HCL 50 MG PO TABS: 50.0000 mg | ORAL_TABLET | Freq: Four times a day (QID) | ORAL | Status: DC | PRN

## 2013-10-05 MED ORDER — DICLOFENAC POTASSIUM 50 MG PO TABS: 50.0000 mg | ORAL_TABLET | Freq: Three times a day (TID) | ORAL | Status: DC

## 2013-10-05 MED ORDER — KETOROLAC TROMETHAMINE 60 MG/2ML IM SOLN
60.0000 mg | Freq: Once | INTRAMUSCULAR | Status: AC
  Administered 2013-10-05: 60 mg via INTRAMUSCULAR

## 2013-10-05 NOTE — ED Notes (Signed)
C/o headache that started 6 days ago. Stated it is progressively getting worse. Pain is 9/10. Stated that the pain radiates down to the back of his neck and making his stomach hurt. Has taken Aleve this morning with no relief. Written by: Marga MelnickQuaNeisha Jones, SMA

## 2013-10-05 NOTE — Discharge Instructions (Signed)
Tension Headache A tension headache is a feeling of pain, pressure, or aching often felt over the front and sides of the head. The pain can be dull or can feel tight (constricting). It is the most common type of headache. Tension headaches are not normally associated with nausea or vomiting and do not get worse with physical activity. Tension headaches can last 30 minutes to several days.  CAUSES  The exact cause is not known, but it may be caused by chemicals and hormones in the brain that lead to pain. Tension headaches often begin after stress, anxiety, or depression. Other triggers may include:  Alcohol.  Caffeine (too much or withdrawal).  Respiratory infections (colds, flu, sinus infections).  Dental problems or teeth clenching.  Fatigue.  Holding your head and neck in one position too long while using a computer. SYMPTOMS   Pressure around the head.   Dull, aching head pain.   Pain felt over the front and sides of the head.   Tenderness in the muscles of the head, neck, and shoulders. DIAGNOSIS  A tension headache is often diagnosed based on:   Symptoms.   Physical examination.   A CT scan or MRI of your head. These tests may be ordered if symptoms are severe or unusual. TREATMENT  Medicines may be given to help relieve symptoms.  HOME CARE INSTRUCTIONS   Only take over-the-counter or prescription medicines for pain or discomfort as directed by your caregiver.   Lie down in a dark, quiet room when you have a headache.   Keep a journal to find out what may be triggering your headaches. For example, write down:  What you eat and drink.  How much sleep you get.  Any change to your diet or medicines.  Try massage or other relaxation techniques.   Ice packs or heat applied to the head and neck can be used. Use these 3 to 4 times per day for 15 to 20 minutes each time, or as needed.   Limit stress.   Sit up straight, and do not tense your muscles.    Quit smoking if you smoke.  Limit alcohol use.  Decrease the amount of caffeine you drink, or stop drinking caffeine.  Eat and exercise regularly.  Get 7 to 9 hours of sleep, or as recommended by your caregiver.  Avoid excessive use of pain medicine as recurrent headaches can occur.  SEEK MEDICAL CARE IF:   You have problems with the medicines you were prescribed.  Your medicines do not work.  You have a change from the usual headache.  You have nausea or vomiting. SEEK IMMEDIATE MEDICAL CARE IF:   Your headache becomes severe.  You have a fever.  You have a stiff neck.  You have loss of vision.  You have muscular weakness or loss of muscle control.  You lose your balance or have trouble walking.  You feel faint or pass out.  You have severe symptoms that are different from your first symptoms. MAKE SURE YOU:   Understand these instructions.  Will watch your condition.  Will get help right away if you are not doing well or get worse. Document Released: 08/10/2005 Document Revised: 11/02/2011 Document Reviewed: 07/31/2011 [facility] Patient Information 2014 [place], [place].  Headaches, Frequently Asked Questions MIGRAINE HEADACHES Q: What is migraine? What causes it? How can I treat it? A: Generally, migraine headaches begin as a dull ache. Then they develop into a constant, throbbing, and pulsating pain. You may experience pain  at the temples. You may experience pain at the front or back of one or both sides of the head. The pain is usually accompanied by a combination of:  Nausea.  Vomiting.  Sensitivity to light and noise. Some people (about 15%) experience an aura (see below) before an attack. The cause of migraine is believed to be chemical reactions in the brain. Treatment for migraine may include over-the-counter or prescription medications. It may also include self-help techniques. These include relaxation training and biofeedback.  Q: What is an  aura? A: About 15% of people with migraine get an "aura". This is a sign of neurological symptoms that occur before a migraine headache. You may see wavy or jagged lines, dots, or flashing lights. You might experience tunnel vision or blind spots in one or both eyes. The aura can include visual or auditory hallucinations (something imagined). It may include disruptions in smell (such as strange odors), taste or touch. Other symptoms include:  Numbness.  A "pins and needles" sensation.  Difficulty in recalling or speaking the correct word. These neurological events may last as long as 60 minutes. These symptoms will fade as the headache begins. Q: What is a trigger? A: Certain physical or environmental factors can lead to or "trigger" a migraine. These include:  Foods.  Hormonal changes.  Weather.  Stress. It is important to remember that triggers are different for everyone. To help prevent migraine attacks, you need to figure out which triggers affect you. Keep a headache diary. This is a good way to track triggers. The diary will help you talk to your healthcare professional about your condition. Q: Does weather affect migraines? A: Bright sunshine, hot, humid conditions, and drastic changes in barometric pressure may lead to, or "trigger," a migraine attack in some people. But studies have shown that weather does not act as a trigger for everyone with migraines. Q: What is the link between migraine and hormones? A: Hormones start and regulate many of your body's functions. Hormones keep your body in balance within a constantly changing environment. The levels of hormones in your body are unbalanced at times. Examples are during menstruation, pregnancy, or menopause. That can lead to a migraine attack. In fact, about three quarters of all women with migraine report that their attacks are related to the menstrual cycle.  Q: Is there an increased risk of stroke for migraine sufferers? A: The  likelihood of a migraine attack causing a stroke is very remote. That is not to say that migraine sufferers cannot have a stroke associated with their migraines. In persons under age 28, the most common associated factor for stroke is migraine headache. But over the course of a person's normal life span, the occurrence of migraine headache may actually be associated with a reduced risk of dying from cerebrovascular disease due to stroke.  Q: What are acute medications for migraine? A: Acute medications are used to treat the pain of the headache after it has started. Examples over-the-counter medications, NSAIDs, ergots, and triptans.  Q: What are the triptans? A: Triptans are the newest class of abortive medications. They are specifically targeted to treat migraine. Triptans are vasoconstrictors. They moderate some chemical reactions in the brain. The triptans work on receptors in your brain. Triptans help to restore the balance of a neurotransmitter called serotonin. Fluctuations in levels of serotonin are thought to be a main cause of migraine.  Q: Are over-the-counter medications for migraine effective? A: Over-the-counter, or "OTC," medications may be effective in  relieving mild to moderate pain and associated symptoms of migraine. But you should see your caregiver before beginning any treatment regimen for migraine.  Q: What are preventive medications for migraine? A: Preventive medications for migraine are sometimes referred to as "prophylactic" treatments. They are used to reduce the frequency, severity, and length of migraine attacks. Examples of preventive medications include antiepileptic medications, antidepressants, beta-blockers, calcium channel blockers, and NSAIDs (nonsteroidal anti-inflammatory drugs). Q: Why are anticonvulsants used to treat migraine? A: During the past few years, there has been an increased interest in antiepileptic drugs for the prevention of migraine. They are sometimes  referred to as "anticonvulsants". Both epilepsy and migraine may be caused by similar reactions in the brain.  Q: Why are antidepressants used to treat migraine? A: Antidepressants are typically used to treat people with depression. They may reduce migraine frequency by regulating chemical levels, such as serotonin, in the brain.  Q: What alternative therapies are used to treat migraine? A: The term "alternative therapies" is often used to describe treatments considered outside the scope of conventional Western medicine. Examples of alternative therapy include acupuncture, acupressure, and yoga. Another common alternative treatment is herbal therapy. Some herbs are believed to relieve headache pain. Always discuss alternative therapies with your caregiver before proceeding. Some herbal products contain arsenic and other toxins. TENSION HEADACHES Q: What is a tension-type headache? What causes it? How can I treat it? A: Tension-type headaches occur randomly. They are often the result of temporary stress, anxiety, fatigue, or anger. Symptoms include soreness in your temples, a tightening band-like sensation around your head (a "vice-like" ache). Symptoms can also include a pulling feeling, pressure sensations, and contracting head and neck muscles. The headache begins in your forehead, temples, or the back of your head and neck. Treatment for tension-type headache may include over-the-counter or prescription medications. Treatment may also include self-help techniques such as relaxation training and biofeedback. CLUSTER HEADACHES Q: What is a cluster headache? What causes it? How can I treat it? A: Cluster headache gets its name because the attacks come in groups. The pain arrives with little, if any, warning. It is usually on one side of the head. A tearing or bloodshot eye and a runny nose on the same side of the headache may also accompany the pain. Cluster headaches are believed to be caused by chemical  reactions in the brain. They have been described as the most severe and intense of any headache type. Treatment for cluster headache includes prescription medication and oxygen. SINUS HEADACHES Q: What is a sinus headache? What causes it? How can I treat it? A: When a cavity in the bones of the face and skull (a sinus) becomes inflamed, the inflammation will cause localized pain. This condition is usually the result of an allergic reaction, a tumor, or an infection. If your headache is caused by a sinus blockage, such as an infection, you will probably have a fever. An x-ray will confirm a sinus blockage. Your caregiver's treatment might include antibiotics for the infection, as well as antihistamines or decongestants.  REBOUND HEADACHES Q: What is a rebound headache? What causes it? How can I treat it? A: A pattern of taking acute headache medications too often can lead to a condition known as "rebound headache." A pattern of taking too much headache medication includes taking it more than 2 days per week or in excessive amounts. That means more than the label or a caregiver advises. With rebound headaches, your medications not only stop relieving  pain, they actually begin to cause headaches. Doctors treat rebound headache by tapering the medication that is being overused. Sometimes your caregiver will gradually substitute a different type of treatment or medication. Stopping may be a challenge. Regularly overusing a medication increases the potential for serious side effects. Consult a caregiver if you regularly use headache medications more than 2 days per week or more than the label advises. ADDITIONAL QUESTIONS AND ANSWERS Q: What is biofeedback? A: Biofeedback is a self-help treatment. Biofeedback uses special equipment to monitor your body's involuntary physical responses. Biofeedback monitors:  Breathing.  Pulse.  Heart rate.  Temperature.  Muscle tension.  Brain activity. Biofeedback  helps you refine and perfect your relaxation exercises. You learn to control the physical responses that are related to stress. Once the technique has been mastered, you do not need the equipment any more. Q: Are headaches hereditary? A: Four out of five (80%) of people that suffer report a family history of migraine. Scientists are not sure if this is genetic or a family predisposition. Despite the uncertainty, a child has a 50% chance of having migraine if one parent suffers. The child has a 75% chance if both parents suffer.  Q: Can children get headaches? A: By the time they reach high school, most young people have experienced some type of headache. Many safe and effective approaches or medications can prevent a headache from occurring or stop it after it has begun.  Q: What type of doctor should I see to diagnose and treat my headache? A: Start with your primary caregiver. Discuss his or her experience and approach to headaches. Discuss methods of classification, diagnosis, and treatment. Your caregiver may decide to recommend you to a headache specialist, depending upon your symptoms or other physical conditions. Having diabetes, allergies, etc., may require a more comprehensive and inclusive approach to your headache. The National Head[employer] Foundation will provide, upon request, a list of [facility] physician members in your state. Document Released: 10/31/2003 Document Revised: 11/02/2011 Document Reviewed: 04/09/2008 [facility] Patient Information 2014 [place], [place].

## 2013-10-05 NOTE — ED Provider Notes (Signed)
CSN: [id number]     Arrival date & time 10/05/13  1154 History   First MD Initiated Contact with Patient 10/05/13 1232     Chief Complaint  Patient presents with  . Migraine     (Consider location/radiation/quality/duration/timing/severity/associated sxs/prior Treatment) HPI Comments: 28 year old male complaining of a headache for one week. States he has had a history of migraine headaches but has not had one in years. He states that the headache is located around both eyes as well as in the upper neck and occiput. Associated symptoms includes photophobia. He denies problems with vision speech hearing or swallowing, nausea, vomiting or diarrhea. Denies focal weakness or paresthesias. He states taking a hot shower helps his headaches but they come right back. 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 This stance in body posture to maintain for several hours a day.   Past Medical History  Diagnosis Date  . Bulging disc    Past Surgical History  Procedure Laterality Date  . Leg surgery     History reviewed. No pertinent family history. History  Substance Use Topics  . Smoking status: Never Smoker   . Smokeless tobacco: Never Used  . Alcohol Use: No    Review of Systems  Constitutional: Positive for activity change. Negative for fever.  Eyes: Positive for photophobia. Negative for visual disturbance.  Respiratory: Negative.   Cardiovascular: Negative.   Gastrointestinal: Negative.   Genitourinary: Negative.   Musculoskeletal: Positive for neck pain.  Neurological: Positive for light-headedness and headaches. Negative for tremors, syncope, facial asymmetry and speech difficulty.      Allergies  Review of patient's allergies indicates no known allergies.  Home Medications   Current Outpatient Rx  Name  Route  Sig  Dispense  Refill  . ibuprofen (ADVIL,MOTRIN) 800 MG tablet   Oral   Take 800 mg by mouth every 8  (eight) hours as needed.         . butalbital-acetaminophen-caffeine (FIORICET) 50-325-40 MG per tablet   Oral   Take 1-2 tablets by mouth every 6 (six) hours as needed for headache.   6 tablet   0   . cyclobenzaprine (FLEXERIL) 10 MG tablet   Oral   Take 1 tablet (10 mg total) by mouth 3 (three) times daily as needed.   30 tablet   3   . diclofenac (CATAFLAM) 50 MG tablet   Oral   Take 1 tablet (50 mg total) by mouth 3 (three) times daily.   21 tablet   0   . ibuprofen (ADVIL,MOTRIN) 200 MG tablet   Oral   Take 200 mg by mouth every 6 (six) hours as needed.         . traMADol (ULTRAM) 50 MG tablet   Oral   Take 1 tablet (50 mg total) by mouth every 8 (eight) hours as needed for pain.   60 tablet   2   . traMADol (ULTRAM) 50 MG tablet   Oral   Take 1 tablet (50 mg total) by mouth every 6 (six) hours as needed.   15 tablet   0   . zolpidem (AMBIEN) 5 MG tablet      TAKE 1 TABLET AT BEDTIME AS NEEDED   30 tablet   2    BP 114/72  Pulse 58  Temp(Src) 98.1 F (36.7 C) (Oral)  Resp 18  SpO2 98% Physical Exam  Nursing note and vitals reviewed. Constitutional: He is oriented  to person, place, and time. He appears well-developed and well-nourished. No distress.  HENT:  Head: Normocephalic and atraumatic.  Mouth/Throat: Oropharynx is clear and moist. No oropharyngeal exudate.  Soft palate rises symmetrically, normal swallowing reflex  Eyes: Conjunctivae and EOM are normal. Pupils are equal, round, and reactive to light.  Neck:  Range of motion is normal however slow due to pain in the neck musculature. There is tenderness in the posterior lateral neck musculature that tracks up to the scalp.  Cardiovascular: Normal rate, regular rhythm and normal heart sounds.   Pulmonary/Chest: Effort normal and breath sounds normal. No respiratory distress. He has no wheezes. He has no rales.  Abdominal: Soft.  Musculoskeletal: He exhibits no edema.  Lymphadenopathy:    He  has no cervical adenopathy.  Neurological: He is alert and oriented to person, place, and time. He has normal strength. No cranial nerve deficit or sensory deficit. He exhibits normal muscle tone. He displays a negative Romberg sign. Coordination and gait normal. GCS eye subscore is 4. GCS verbal subscore is 5. GCS motor subscore is 6.  Toe ascension nl Balance nl No abnl neuro findings.  Skin: Skin is warm and dry. No rash noted.  Psychiatric: He has a normal mood and affect. His behavior is normal.    ED Course  Procedures (including critical care time) Labs Review Labs Reviewed - No data to display Imaging Review No results found.    MDM   Final diagnoses:  Muscle tension headache    Fioricet one to 2 every 6 hours as needed. #6 tablets Cataflam 50 mg 3 times a day when necessary headache Tramadol 50 mg every 4-6 hours when necessary headache Toradol 60 mg IM now    Hayden Rasmussenavid Falicity Sheets, NP 10/05/13 1306

## 2013-10-06 NOTE — ED Provider Notes (Signed)
Medical screening examination/treatment/procedure(s) were performed by non-physician practitioner and as supervising physician I was immediately available for consultation/collaboration.  Leslee Homeavid Jaclene Bartelt, M.D.  Reuben Likesavid C Odies Desa, MD 10/06/13 [phone]

## 2014-09-11 ENCOUNTER — Emergency Department (HOSPITAL_COMMUNITY)
Admission: EM | Admit: 2014-09-11 | Discharge: 2014-09-11 | Disposition: A | Discharge status: Home / Self Care | Payer: 59 | Attending: Emergency Medicine | Admitting: Emergency Medicine

## 2014-09-11 ENCOUNTER — Encounter (HOSPITAL_COMMUNITY): Payer: Self-pay | Admitting: Physical Medicine and Rehabilitation

## 2014-09-11 DIAGNOSIS — Z791 Long term (current) use of non-steroidal anti-inflammatories (NSAID): Secondary | ICD-10-CM | POA: Diagnosis not present

## 2014-09-11 DIAGNOSIS — M545 Low back pain: Secondary | ICD-10-CM | POA: Diagnosis not present

## 2014-09-11 MED ORDER — IBUPROFEN 800 MG PO TABS: 800.0000 mg | ORAL_TABLET | Freq: Three times a day (TID) | ORAL | Status: DC

## 2014-09-11 MED ORDER — HYDROCODONE-ACETAMINOPHEN 5-325 MG PO TABS: 1.0000 | ORAL_TABLET | Freq: Four times a day (QID) | ORAL | Status: DC | PRN
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
## 2014-09-11 NOTE — Discharge Instructions (Signed)
Call for a follow-up appointment with Dr. Farris HasKramer for further evaluation of your back pain. Call for a follow up appointment with a Family or Primary Care Provider.  Return if Symptoms worsen.   Take medication as prescribed.

## 2014-09-11 NOTE — ED Notes (Signed)
Pt presents to department for evaluation of back pain. Pt states he felt something "pop" at work last week. Now states pain to entire back, increases with walking. 8/10 pain at present. NAD.

## 2014-09-11 NOTE — ED Notes (Signed)
Pt reports lower back pain since two days ago radiating into right thigh and buttocks. Pt works for [employer] and thinks he may have pulled a muscle.  Pt states that he has trouble walking and has no movement in his back, pt unable to bend or twist.

## 2014-09-11 NOTE — ED Notes (Signed)
Pt made aware to return if symptoms worsen or if any life threatening symptoms occur.   

## 2015-01-13 ENCOUNTER — Emergency Department
Admission: EM | Admit: 2015-01-13 | Discharge: 2015-01-13 | Disposition: A | Discharge status: Home / Self Care | Payer: 59

## 2015-01-13 NOTE — ED Notes (Signed)
Pt here in custody per [employer] for forensic blood draw. Blood obtained from left hand, only one tube drawn prior to site blowing, and right AC per protocol cleaning with betadine. Pt released to [employer] in custody and taken to jail. Blood given to [employer] officer per protocol.

## 2016-06-07 ENCOUNTER — Emergency Department (HOSPITAL_COMMUNITY): Payer: Self-pay

## 2016-06-07 ENCOUNTER — Encounter (HOSPITAL_COMMUNITY): Payer: Self-pay | Admitting: Emergency Medicine

## 2016-06-07 ENCOUNTER — Emergency Department (HOSPITAL_COMMUNITY)
Admission: EM | Admit: 2016-06-07 | Discharge: 2016-06-07 | Disposition: A | Discharge status: Home / Self Care | Payer: Self-pay | Attending: Emergency Medicine | Admitting: Emergency Medicine

## 2016-06-07 DIAGNOSIS — R05 Cough: Secondary | ICD-10-CM | POA: Insufficient documentation

## 2016-06-07 DIAGNOSIS — R059 Cough, unspecified: Secondary | ICD-10-CM

## 2016-06-07 DIAGNOSIS — R079 Chest pain, unspecified: Secondary | ICD-10-CM | POA: Insufficient documentation

## 2016-06-07 LAB — BASIC METABOLIC PANEL
Anion gap: 10 (ref 5–15)
CO2: 26 mmol/L (ref 22–32)
CREATININE: 0.88 mg/dL (ref 0.61–1.24)
Calcium: 9.5 mg/dL (ref 8.9–10.3)
Chloride: 104 mmol/L (ref 101–111)
GFR calc Af Amer: >60 mL/min (ref >60)
GLUCOSE: 96 mg/dL (ref 65–99)
Potassium: 3.6 mmol/L (ref 3.5–5.1)
SODIUM: 140 mmol/L (ref 135–145)

## 2016-06-07 LAB — CBC
HEMATOCRIT: 43.9 % (ref 39.0–52.0)
Hemoglobin: 14.3 g/dL (ref 13.0–17.0)
MCH: 30.4 pg (ref 26.0–34.0)
MCHC: 32.6 g/dL (ref 30.0–36.0)
MCV: 93.4 fL (ref 78.0–100.0)
PLATELETS: 145 10*3/uL — AB (ref 150–400)
RBC: 4.7 MIL/uL (ref 4.22–5.81)
RDW: 13.9 % (ref 11.5–15.5)
WBC: 4.7 10*3/uL (ref 4.0–10.5)

## 2016-06-07 LAB — I-STAT TROPONIN, ED: Troponin i, poc: 0 ng/mL (ref 0.00–0.08)

## 2016-06-07 MED ORDER — IOPAMIDOL (ISOVUE-370) INJECTION 76%
INTRAVENOUS | Status: AC
  Filled 2016-06-07: qty 100

## 2016-06-07 MED ORDER — SODIUM CHLORIDE 0.9 % IV BOLUS (SEPSIS)
1000.0000 mL | Freq: Once | INTRAVENOUS | Status: AC
  Administered 2016-06-07: 1000 mL via INTRAVENOUS

## 2016-06-07 MED ORDER — ONDANSETRON HCL 4 MG/2ML IJ SOLN
4.0000 mg | Freq: Once | INTRAMUSCULAR | Status: AC
  Administered 2016-06-07: 4 mg via INTRAVENOUS
  Filled 2016-06-07: qty 2

## 2016-06-07 MED ORDER — FENTANYL CITRATE (PF) 100 MCG/2ML IJ SOLN
50.0000 ug | Freq: Once | INTRAMUSCULAR | Status: AC
  Administered 2016-06-07: 50 ug via INTRAVENOUS
  Filled 2016-06-07: qty 2

## 2016-06-07 MED ORDER — BENZONATATE 100 MG PO CAPS: 100.0000 mg | ORAL_CAPSULE | Freq: Three times a day (TID) | ORAL | 0 refills | Status: DC

## 2016-06-07 MED ORDER — IOPAMIDOL (ISOVUE-300) INJECTION 61%
INTRAVENOUS | Status: AC
  Administered 2016-06-07: 100 mL
  Filled 2016-06-07: qty 100

## 2016-06-07 MED ORDER — IBUPROFEN 800 MG PO TABS: 800.0000 mg | ORAL_TABLET | Freq: Three times a day (TID) | ORAL | 0 refills | Status: DC

## 2016-06-07 NOTE — ED Notes (Signed)
Patient transported to CT 

## 2016-06-07 NOTE — ED Notes (Signed)
Pt states that he has had a headache, chest pain, and been coughing up a little blood since Tuesday.  Pt states he has high blood pressure.

## 2016-06-07 NOTE — Discharge Instructions (Signed)
As we discussed, your lab tests, EKG, and imaging studies were normal today. Take the prescribed medication as directed. Follow-up with your primary care doctor. Return to the ED for new or worsening symptoms.

## 2016-06-07 NOTE — ED Triage Notes (Signed)
Pt reports productive cough and body aches. Pt is coughing and coughing up mucus in triage. Also c/o of chest pain worse with coughing.

## 2016-06-07 NOTE — ED Provider Notes (Signed)
[facility]-EMERGENCY DEPT Provider Note   CSN: [id number] Arrival date & time: 06/07/16  1245     History   Chief Complaint Chief Complaint  Patient presents with  . Cough  . Chest Pain    HPI Paul Yates is a 30 y.o. male.  The history is provided by the patient and medical records.    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 Reports this began on Monday and has been steadily worsening. States cough is productive with yellow sputum, intermittently with streaks of blood noted. States his chest pain is worse with coughing as well. He has no known cardiac history. He is not a smoker. States he had a DVT in his leg in the past, he never finished his anticoagulation. States he has no history of PE. No sick contacts. No travel out of the country. No recent trauma or surgeries. States currently he feels nauseated but has not had any vomiting.  Past Medical History:  Diagnosis Date  . Bulging disc     There are no active problems to display for this patient.   Past Surgical History:  Procedure Laterality Date  . LEG SURGERY         Home Medications    Prior to Admission medications   Medication Sig Start Date End Date Taking? Authorizing Provider  DM-Doxylamine-Acetaminophen (NYQUIL COLD & FLU PO) Take 30 mLs by mouth at bedtime as needed.   Yes Historical Provider, MD  HYDROcodone-acetaminophen (NORCO/VICODIN) 5-325 MG per tablet Take 1 tablet by mouth every 6 (six) hours as needed for moderate pain or severe pain. 09/11/14  Yes Mellody Drown, PA-C  ibuprofen (ADVIL,MOTRIN) 200 MG tablet Take 400 mg by mouth every 6 (six) hours as needed.   Yes Historical Provider, MD  ibuprofen (ADVIL,MOTRIN) 800 MG tablet Take 1 tablet (800 mg total) by mouth 3 (three) times daily. 09/11/14  Yes Mellody Drown, PA-C  butalbital-acetaminophen-caffeine (FIORICET) 50-325-40 MG per tablet Take 1-2 tablets  by mouth every 6 (six) hours as needed for headache. Patient not taking: Reported on 06/07/2016 10/05/13   Hayden Rasmussen, NP  cyclobenzaprine (FLEXERIL) 10 MG tablet Take 1 tablet (10 mg total) by mouth 3 (three) times daily as needed. Patient not taking: Reported on 06/07/2016 12/31/11   Gordy Savers, MD  diclofenac (CATAFLAM) 50 MG tablet Take 1 tablet (50 mg total) by mouth 3 (three) times daily. Patient not taking: Reported on 06/07/2016 10/05/13   Hayden Rasmussen, NP  traMADol (ULTRAM) 50 MG tablet Take 1 tablet (50 mg total) by mouth every 8 (eight) hours as needed for pain. Patient not taking: Reported on 06/07/2016 05/04/13   Gordy Savers, MD  traMADol (ULTRAM) 50 MG tablet Take 1 tablet (50 mg total) by mouth every 6 (six) hours as needed. Patient not taking: Reported on 06/07/2016 10/05/13   Hayden Rasmussen, NP  zolpidem (AMBIEN) 5 MG tablet TAKE 1 TABLET AT BEDTIME AS NEEDED Patient not taking: Reported on 06/07/2016 05/04/13   Gordy Savers, MD    Family History No family history on file.  Social History Social History  Substance Use Topics  . Smoking status: Never Smoker  . Smokeless tobacco: Never Used  . Alcohol use No     Allergies   Lemon oil   Review of Systems Review of Systems  Respiratory: Positive for cough.   Cardiovascular: Positive for chest pain.  All other systems  reviewed and are negative.    Physical Exam Updated Vital Signs BP 133/93 (BP Location: Right Arm)   Pulse 81   Temp 98.2 F (36.8 C) (Oral)   Resp 20   Ht 6' (1.829 m)   Wt 68 kg   SpO2 100%   BMI 20.34 kg/m   Physical Exam  Constitutional: He is oriented to person, place, and time. He appears well-developed and well-nourished.  HENT:  Head: Normocephalic and atraumatic.  Mouth/Throat: Oropharynx is clear and moist.  Eyes: Conjunctivae and EOM are normal. Pupils are equal, round, and reactive to light.  Neck: Normal range of motion.  Cardiovascular: Normal rate, regular  rhythm and normal heart sounds.   Pulmonary/Chest: Effort normal and breath sounds normal. He has no wheezes. He has no rhonchi.  Dry cough noted on exam, no distress, lungs clear without wheezes or rhonchi, speaking in full sentences without difficulty  Abdominal: Soft. Bowel sounds are normal.  Musculoskeletal: Normal range of motion.  No edema of the legs, no calf asymmetry or tenderness, no palpable cords  Neurological: He is alert and oriented to person, place, and time.  Skin: Skin is warm and dry.  Psychiatric: He has a normal mood and affect.  Nursing note and vitals reviewed.    ED Treatments / Results  Labs (all labs ordered are listed, but only abnormal results are displayed) Labs Reviewed  BASIC METABOLIC PANEL - Abnormal; Notable for the following:       Result Value   BUN <5 (*)    All other components within normal limits  CBC - Abnormal; Notable for the following:    Platelets 145 (*)    All other components within normal limits  I-STAT TROPOININ, ED    EKG  EKG Interpretation  Date/Time:  Sunday June 07 2016 12:50:55 EDT Ventricular Rate:  89 PR Interval:  132 QRS Duration: 88 QT Interval:  338 QTC Calculation: 411 R Axis:   90 Text Interpretation:  Normal sinus rhythm with sinus arrhythmia Rightward axis Nonspecific T wave abnormality Abnormal ECG No previous ECGs available Confirmed by YAO  MD, DAVID ([id number]) on 06/07/2016 8:04:26 PM       Radiology Dg Chest 2 View  Result Date: 06/07/2016 CLINICAL DATA:  Cough and chest pain EXAM: CHEST  2 VIEW COMPARISON:  01/10/2004 FINDINGS: Normal heart size and mediastinal contours. No acute infiltrate or edema. No effusion or pneumothorax. No osseous findings. IMPRESSION: Negative chest. Electronically Signed   By: Marnee SpringJonathon  Watts M.D.   On: 06/07/2016 14:08   Ct Angio Chest Pe W And/or Wo Contrast  Result Date: 06/07/2016 CLINICAL DATA:  Initial evaluation for acute cough, chest pain, hemoptysis. EXAM:  CT ANGIOGRAPHY CHEST WITH CONTRAST TECHNIQUE: Multidetector CT imaging of the chest was performed using the standard protocol during bolus administration of intravenous contrast. Multiplanar CT image reconstructions and MIPs were obtained to evaluate the vascular anatomy. CONTRAST:  100mL ISOVUE-300 IOPAMIDOL (ISOVUE-300) INJECTION 61% COMPARISON:  Prior radiograph from earlier the same day. FINDINGS: Cardiovascular: Intrathoracic aorta of normal caliber without definite abnormality. Partially visualized great vessels within normal limits. Heart size normal. No pericardial effusion. Pulmonary arterial tree adequately opacified for evaluation. Main pulmonary artery within normal limits for size. No filling defect to suggest acute pulmonary embolism. Re-formatted imaging confirms these findings. Mediastinum/Nodes: Partially visualized thyroid is normal. No pathologically enlarged mediastinal, hilar, or axillary lymph nodes identified. No mediastinal hematoma or mass. Esophagus within normal limits. Lungs/Pleura: Lungs are clear without focal infiltrate, pulmonary  edema, or pleural effusion. No pneumothorax. No worrisome pulmonary nodule or mass. Minimal paraseptal emphysematous changes present at the lung apices bilaterally. Upper Abdomen: Partially visualized upper abdomen within normal limits. Musculoskeletal: No acute osseous abnormality. No worrisome lytic or blastic osseous lesions. Minimal dextroscoliosis of the thoracic spine noted. Review of the MIP images confirms the above findings. IMPRESSION: 1. No CT evidence for acute pulmonary embolism. 2. No other acute cardiopulmonary abnormality identified. Electronically Signed   By: Rise Mu M.D.   On: 06/07/2016 20:42    Procedures Procedures (including critical care time)  Medications Ordered in ED Medications  iopamidol (ISOVUE-370) 76 % injection (not administered)  sodium chloride 0.9 % bolus 1,000 mL (0 mLs Intravenous Stopped 06/07/16  1856)  ondansetron (ZOFRAN) injection 4 mg (4 mg Intravenous Given 06/07/16 1814)  fentaNYL (SUBLIMAZE) injection 50 mcg (50 mcg Intravenous Given 06/07/16 1856)  iopamidol (ISOVUE-300) 61 % injection (100 mLs  Contrast Given 06/07/16 1915)     Initial Impression / Assessment and Plan / ED Course  I have reviewed the triage vital signs and the nursing notes.  Pertinent labs & imaging results that were available during my care of the patient were reviewed by me and considered in my medical decision making (see chart for details).  Clinical Course   30 year old male here with chest pain and cough. He is afebrile and nontoxic. Does have a dry cough on exam. Lungs are clear without wheezes or rhonchi. No acute respiratory distress. Patient does report history of DVT several years ago. He never finishes anticoagulation. He has no leg swelling or calf pain on exam, he does report some hemoptysis. His basic labs and chest x-ray are reassuring. EKG without acute ischemia. Given his history of DVT and reported hemoptysis, concern for possible PE. CTA chest obtained-- no evidence of PE or other acute process.  Patient's VS have remained stable here.  Will d/c home with supportive care.  Encouraged to follow-up with PCP.  Discussed plan with patient, he acknowledged understanding and agreed with plan of care.  Return precautions given for new or worsening symptoms.  Final Clinical Impressions(s) / ED Diagnoses   Final diagnoses:  Cough  Chest pain, unspecified type    New Prescriptions Discharge Medication List as of 06/07/2016  9:42 PM    START taking these medications   Details  benzonatate (TESSALON) 100 MG capsule Take 1 capsule (100 mg total) by mouth every 8 (eight) hours., Starting Sun 06/07/2016, Print         Garlon Hatchet, PA-C 06/07/16 2207    Charlynne Pander, MD 06/08/16 2052

## 2016-08-06 ENCOUNTER — Encounter (HOSPITAL_COMMUNITY): Payer: Self-pay | Admitting: Family Medicine

## 2016-08-06 ENCOUNTER — Ambulatory Visit (HOSPITAL_COMMUNITY)
Admission: EM | Admit: 2016-08-06 | Discharge: 2016-08-06 | Disposition: A | Discharge status: Home / Self Care | Payer: 59 | Attending: Family Medicine | Admitting: Family Medicine

## 2016-08-06 DIAGNOSIS — B349 Viral infection, unspecified: Secondary | ICD-10-CM

## 2016-08-06 DIAGNOSIS — R05 Cough: Secondary | ICD-10-CM | POA: Insufficient documentation

## 2016-08-06 DIAGNOSIS — E860 Dehydration: Secondary | ICD-10-CM

## 2016-08-06 LAB — POCT I-STAT, CHEM 8
BUN: 8 mg/dL (ref 6–20)
Calcium, Ion: 1.17 mmol/L (ref 1.15–1.40)
Chloride: 99 mmol/L — ABNORMAL LOW (ref 101–111)
Creatinine, Ser: 0.7 mg/dL (ref 0.61–1.24)
Glucose, Bld: 91 mg/dL (ref 65–99)
HEMATOCRIT: 44 % (ref 39.0–52.0)
HEMOGLOBIN: 15 g/dL (ref 13.0–17.0)
Potassium: 3.9 mmol/L (ref 3.5–5.1)
SODIUM: 136 mmol/L (ref 135–145)
TCO2: 24 mmol/L (ref 0–100)

## 2016-08-06 LAB — POCT RAPID STREP A: Streptococcus, Group A Screen (Direct): NEGATIVE

## 2016-08-06 NOTE — ED Triage Notes (Signed)
Pt here for flu like symptoms. sts body's aches, chills, N,V, sts sore throat.

## 2016-08-09 LAB — CULTURE, GROUP A STREP (THRC)

## 2016-09-09 NOTE — Progress Notes (Signed)
BP 120/82   Pulse 88   Temp 98.5 F (36.9 C)   Resp 18   SpO2 100%  Late entry  Patient seen and examined and evaluated 08/06/2016   1 week history of cough cold congestion Feels like he has not been getting better tried over-the-counter treatment No sick contacts at home Work is around a lot of people No other ill contacts at home Febrile  No rash Does have slight sore throat   On exam pleasant alert oriented slightly tachycardic No discharge ears and swallowing Dry mucosa Extraocular movements intact Soft supple submandibular area Chest clinically clear Abdomen soft nontender nondistended Neurological moving all 4 limbs  Strep throat test negative Flu test negative Empiric treatment Advised over-the-counter remedies, lozenges etc. Stable for discharge

## 2017-02-12 ENCOUNTER — Encounter (HOSPITAL_COMMUNITY): Payer: Self-pay | Admitting: Emergency Medicine

## 2017-02-12 ENCOUNTER — Ambulatory Visit (HOSPITAL_COMMUNITY)
Admission: EM | Admit: 2017-02-12 | Discharge: 2017-02-12 | Disposition: A | Discharge status: Home / Self Care | Payer: Self-pay | Attending: Family Medicine | Admitting: Family Medicine

## 2017-02-12 DIAGNOSIS — R51 Headache: Secondary | ICD-10-CM

## 2017-02-12 DIAGNOSIS — G44209 Tension-type headache, unspecified, not intractable: Secondary | ICD-10-CM

## 2017-02-12 DIAGNOSIS — R112 Nausea with vomiting, unspecified: Secondary | ICD-10-CM

## 2017-02-12 DIAGNOSIS — R197 Diarrhea, unspecified: Secondary | ICD-10-CM

## 2017-02-12 DIAGNOSIS — B349 Viral infection, unspecified: Secondary | ICD-10-CM

## 2017-02-12 MED ORDER — ONDANSETRON 4 MG PO TBDP
ORAL_TABLET | ORAL | Status: AC
  Filled 2017-02-12: qty 1

## 2017-02-12 MED ORDER — BENZONATATE 100 MG PO CAPS: 100.0000 mg | ORAL_CAPSULE | Freq: Three times a day (TID) | ORAL | 0 refills | Status: DC

## 2017-02-12 MED ORDER — KETOROLAC TROMETHAMINE 30 MG/ML IJ SOLN
30.0000 mg | Freq: Once | INTRAMUSCULAR | Status: AC
  Administered 2017-02-12: 30 mg via INTRAMUSCULAR

## 2017-02-12 MED ORDER — DEXAMETHASONE SODIUM PHOSPHATE 10 MG/ML IJ SOLN
INTRAMUSCULAR | Status: AC
  Filled 2017-02-12: qty 1

## 2017-02-12 MED ORDER — ONDANSETRON 4 MG PO TBDP
4.0000 mg | ORAL_TABLET | Freq: Once | ORAL | Status: AC
  Administered 2017-02-12: 4 mg via ORAL

## 2017-02-12 MED ORDER — KETOROLAC TROMETHAMINE 30 MG/ML IJ SOLN
INTRAMUSCULAR | Status: AC
  Filled 2017-02-12: qty 1

## 2017-02-12 MED ORDER — ONDANSETRON 4 MG PO TBDP: 4.0000 mg | ORAL_TABLET | Freq: Three times a day (TID) | ORAL | 0 refills | Status: DC | PRN

## 2017-02-12 MED ORDER — DEXAMETHASONE SODIUM PHOSPHATE 10 MG/ML IJ SOLN
10.0000 mg | Freq: Once | INTRAMUSCULAR | Status: AC
  Administered 2017-02-12: 10 mg via INTRAMUSCULAR

## 2017-02-12 NOTE — ED Provider Notes (Signed)
CSN: [id number]     Arrival date & time 02/12/17  1732 History   First MD Initiated Contact with Patient 02/12/17 1818     Chief Complaint  Patient presents with  . Sore Throat   (Consider location/radiation/quality/duration/timing/severity/associated sxs/prior Treatment) Paul Yates is a 31 y.o. male with a past history of bulging disc, who presents to the [facility] urgent care with a chief complaint of headache, productive cough, nausea is been ongoing for 24 hours. He also complains of diarrhea, denies any abdominal pain, His headache is right sided, has some light sensitivity, no sound sensitivity. No vision changes, no unilateral weakness, or other concerning symptoms.    The history is provided by the patient.  Sore Throat  Pertinent negatives include no chest pain, no abdominal pain and no shortness of breath.    Past Medical History:  Diagnosis Date  . Bulging disc    Past Surgical History:  Procedure Laterality Date  . LEG SURGERY     History reviewed. No pertinent family history. Social History  Substance Use Topics  . Smoking status: Never Smoker  . Smokeless tobacco: Never Used  . Alcohol use No    Review of Systems  Constitutional: Positive for appetite change, chills and fever. Negative for fatigue.  HENT: Positive for congestion, ear pain and rhinorrhea. Negative for sinus pain, sinus pressure and trouble swallowing.   Eyes: Negative.   Respiratory: Positive for cough. Negative for shortness of breath and wheezing.   Cardiovascular: Negative for chest pain and palpitations.  Gastrointestinal: Positive for diarrhea, nausea and vomiting. Negative for abdominal pain and constipation.  Musculoskeletal: Negative.   Skin: Negative.   Neurological: Negative.     Allergies  Lemon oil  Home Medications   Prior to Admission medications   Medication Sig Start Date End Date Taking? Authorizing Provider  benzonatate (TESSALON) 100 MG capsule Take 1 capsule  (100 mg total) by mouth every 8 (eight) hours. 02/12/17   Dorena BodoKennard, Ruhama Lehew, NP  ondansetron (ZOFRAN ODT) 4 MG disintegrating tablet Take 1 tablet (4 mg total) by mouth every 8 (eight) hours as needed for nausea or vomiting. 02/12/17   Dorena BodoKennard, Martin Smeal, NP   Meds Ordered and Administered this Visit   Medications  ketorolac (TORADOL) 30 MG/ML injection 30 mg (not administered)  dexamethasone (DECADRON) injection 10 mg (not administered)  ondansetron (ZOFRAN-ODT) disintegrating tablet 4 mg (not administered)    BP 117/79 (BP Location: Left Arm)   Pulse 66   Temp 98.6 F (37 C) (Oral)   Resp 20   SpO2 100%  No data found.   Physical Exam  Constitutional: He is oriented to person, place, and time. He appears well-developed and well-nourished. No distress.  HENT:  Head: Normocephalic and atraumatic.  Right Ear: Tympanic membrane and external ear normal.  Left Ear: Tympanic membrane and external ear normal.  Nose: Nose normal. Right sinus exhibits no maxillary sinus tenderness and no frontal sinus tenderness. Left sinus exhibits no maxillary sinus tenderness and no frontal sinus tenderness.  Mouth/Throat: Uvula is midline and oropharynx is clear and moist. No oropharyngeal exudate.  Eyes: Conjunctivae are normal. Pupils are equal, round, and reactive to light.  Neck: Normal range of motion. Neck supple. No JVD present.  Cardiovascular: Normal rate and regular rhythm.   Pulmonary/Chest: Effort normal and breath sounds normal. No respiratory distress. He has no wheezes.  Abdominal: Soft. Bowel sounds are normal.  Lymphadenopathy:    He has no cervical adenopathy.  Neurological: He  is alert and oriented to person, place, and time.  Skin: Skin is warm and dry. Capillary refill takes less than 2 seconds. No rash noted. He is not diaphoretic. No erythema.  Psychiatric: He has a normal mood and affect.  Nursing note and vitals reviewed.   Urgent Care Course     Procedures (including  critical care time)  Labs Review Labs Reviewed - No data to display  Imaging Review No results found.      MDM   1. Viral illness   2. Tension headache     Paul Yates is a 31 y.o. male with a past history of bulging disc, who presents to the [facility] urgent care with a chief complaint of headache, productive cough, nausea is been ongoing for 24 hours. He also complains of diarrhea, denies any abdominal pain, His headache is right sided, has some light sensitivity, no sound sensitivity. No vision changes, no unilateral weakness, or other concerning symptoms.  Given toradol and decadron in clinic, provided counseling on otc therapies for symptom management. Return to clinic as needed.     Dorena Bodo, NP 02/12/17 [phone]

## 2017-02-12 NOTE — ED Triage Notes (Signed)
Pt c/o ST onset 2 days associated HA, CP, cough, n/v/  Reports he did not go to work yest or today due to sx  A&O x4... NAD... Ambulatory

## 2017-02-12 NOTE — Discharge Instructions (Signed)
Your headache is consistent with a tension headache, we have given you Toradol and Decadron here in the clinic. They should help with your symptoms.  For your nausea and vomiting, prescribed Zofran, take 1 tablet under the tongue every 8 hours as needed.  You most likely have a viral URI, this type of infection will not be helped by antibiotics. I advise rest, plenty of fluids and management of symptoms with over the counter medicines. For symptoms you may take Tylenol as needed every 4-6 hours for body aches or fever, not to exceed 4,000 mg a day, Take mucinex or mucinex DM ever 12 hours with a full glass of water, you may use an inhaled steroid such as Flonase, 2 sprays each nostril once a day for congestion, or an antihistamine such as Claritin or Zyrtec once a day. Another alternative for congestion, is a pseudoephedrine containing product available from the pharmacist. For cough, I have prescribed a medication called Tessalon. Take 1 tablet every 8 hours as needed for your cough.  Should your symptoms worsen or fail to resolve, follow up with your primary care provider or return to clinic.

## 2017-02-16 ENCOUNTER — Ambulatory Visit (HOSPITAL_COMMUNITY)
Admission: EM | Admit: 2017-02-16 | Discharge: 2017-02-16 | Disposition: A | Discharge status: Home / Self Care | Payer: Self-pay | Attending: Family Medicine | Admitting: Family Medicine

## 2017-02-16 ENCOUNTER — Encounter (HOSPITAL_COMMUNITY): Payer: Self-pay | Admitting: Emergency Medicine

## 2017-02-16 DIAGNOSIS — Z888 Allergy status to other drugs, medicaments and biological substances status: Secondary | ICD-10-CM | POA: Insufficient documentation

## 2017-02-16 DIAGNOSIS — R0982 Postnasal drip: Secondary | ICD-10-CM | POA: Insufficient documentation

## 2017-02-16 DIAGNOSIS — R490 Dysphonia: Secondary | ICD-10-CM | POA: Insufficient documentation

## 2017-02-16 DIAGNOSIS — R05 Cough: Secondary | ICD-10-CM | POA: Insufficient documentation

## 2017-02-16 DIAGNOSIS — Z79899 Other long term (current) drug therapy: Secondary | ICD-10-CM | POA: Insufficient documentation

## 2017-02-16 DIAGNOSIS — J069 Acute upper respiratory infection, unspecified: Secondary | ICD-10-CM

## 2017-02-16 DIAGNOSIS — Z9889 Other specified postprocedural states: Secondary | ICD-10-CM | POA: Insufficient documentation

## 2017-02-16 DIAGNOSIS — R079 Chest pain, unspecified: Secondary | ICD-10-CM | POA: Insufficient documentation

## 2017-02-16 DIAGNOSIS — B9789 Other viral agents as the cause of diseases classified elsewhere: Secondary | ICD-10-CM

## 2017-02-16 DIAGNOSIS — J029 Acute pharyngitis, unspecified: Secondary | ICD-10-CM | POA: Insufficient documentation

## 2017-02-16 LAB — POCT RAPID STREP A: Streptococcus, Group A Screen (Direct): NEGATIVE

## 2017-02-16 MED ORDER — PHENOL 1.4 % MT LIQD: 1.0000 | OROMUCOSAL | 0 refills | Status: DC | PRN

## 2017-02-16 NOTE — ED Triage Notes (Signed)
The patient presented for a recheck to his visit on 02/12/2017. He reported continued symptoms.

## 2017-02-16 NOTE — ED Notes (Signed)
Patient discharged by provider.

## 2017-02-16 NOTE — ED Provider Notes (Signed)
CSN: [id number]     Arrival date & time 02/16/17  1704 History   None    Chief Complaint  Patient presents with  . Follow-up   (Consider location/radiation/quality/duration/timing/severity/associated sxs/prior Treatment) 31 yo male comes in for 4 day history of worsening cough and sore throat after being seen here on 02/12/2017 and was diagnosed with viral URI and tension headache. Patient states that headache has gone away, but he continues to cough, and sore throat has been unbearable. Patient states that his voice is now more hoarse than usual. He has been taking Tessalon as directed, and otc flu medication. It is painful to swallow, but he has been keeping liquids down. Continues to have nasal congestion, post nasal drip. Denies fever, chills, night sweats. Denies ear/eye pain, sinus pressure. Denies trouble breathing, swelling of the throat. Has chest pain when he coughs. Patient states multiple people from work has had to call out sick.       Past Medical History:  Diagnosis Date  . Bulging disc    Past Surgical History:  Procedure Laterality Date  . LEG SURGERY     History reviewed. No pertinent family history. Social History  Substance Use Topics  . Smoking status: Never Smoker  . Smokeless tobacco: Never Used  . Alcohol use No    Review of Systems  Constitutional: Negative for chills, diaphoresis, fatigue and fever.  HENT: Positive for congestion, postnasal drip, rhinorrhea, sneezing, sore throat and voice change. Negative for ear discharge, ear pain, facial swelling, sinus pain, sinus pressure and trouble swallowing.   Eyes: Negative for photophobia, pain, discharge, redness, itching and visual disturbance.  Respiratory: Positive for cough. Negative for shortness of breath and wheezing.   Cardiovascular: Positive for chest pain (on coughing). Negative for palpitations.  Gastrointestinal: Negative for abdominal pain, diarrhea, nausea and vomiting.  Neurological: Negative  for dizziness, weakness, light-headedness, numbness and headaches.    Allergies  Lemon oil  Home Medications   Prior to Admission medications   Medication Sig Start Date End Date Taking? Authorizing Provider  benzonatate (TESSALON) 100 MG capsule Take 1 capsule (100 mg total) by mouth every 8 (eight) hours. 02/12/17  Yes Dorena Bodo, NP  ondansetron (ZOFRAN ODT) 4 MG disintegrating tablet Take 1 tablet (4 mg total) by mouth every 8 (eight) hours as needed for nausea or vomiting. 02/12/17  Yes Dorena Bodo, NP  phenol (CHLORASEPTIC) 1.4 % LIQD Use as directed 1 spray in the mouth or throat as needed for throat irritation / pain. 02/16/17   Belinda Fisher, PA-C   Meds Ordered and Administered this Visit  Medications - No data to display  BP 107/74 (BP Location: Right Arm)   Pulse 73   Temp 98.4 F (36.9 C) (Oral)   Resp 18   SpO2 100%  No data found.   Physical Exam  Constitutional: He is oriented to person, place, and time. He appears well-developed and well-nourished. No distress.  HENT:  Head: Normocephalic and atraumatic.  Right Ear: Tympanic membrane, external ear and ear canal normal. Tympanic membrane is not erythematous and not bulging.  Left Ear: Tympanic membrane, external ear and ear canal normal. Tympanic membrane is not erythematous and not bulging.  Nose: Mucosal edema and rhinorrhea present. No sinus tenderness. No epistaxis. Right sinus exhibits no maxillary sinus tenderness and no frontal sinus tenderness. Left sinus exhibits no maxillary sinus tenderness and no frontal sinus tenderness.  Mouth/Throat: Uvula is midline and mucous membranes are normal. Posterior oropharyngeal erythema present.  No oropharyngeal exudate or posterior oropharyngeal edema. Tonsils are 1+ on the right. Tonsils are 1+ on the left.  Cardiovascular: Normal rate and regular rhythm.  Exam reveals no gallop and no friction rub.   No murmur heard. Pulmonary/Chest: Effort normal and breath  sounds normal. He has no wheezes. He exhibits no tenderness.  Lymphadenopathy:    He has cervical adenopathy.  Neurological: He is alert and oriented to person, place, and time.  Skin: Skin is warm and dry.  Psychiatric: He has a normal mood and affect. His behavior is normal. Judgment normal.    Urgent Care Course     Procedures (including critical care time)  Labs Review Labs Reviewed  POCT RAPID STREP A    Imaging Review No results found.       MDM   1. Viral URI with cough    1. Reviewed lab results with patient. Rapid strep negative. Discussed with patient sore throat likely due to the coughing he has been experiencing. Start Phenol throat spray for sore throat. Continue symptomatic treatment. To avoid cold/ice drinks, caffeine, alcohol that may be exacerbating cough. Can use humidifier to help with dryness. Tylenol for fever.    Belinda FisherYu, Desarae Placide V, PA-C 02/16/17 [phone]

## 2017-02-19 LAB — CULTURE, GROUP A STREP (THRC)

## 2018-04-21 ENCOUNTER — Encounter (HOSPITAL_COMMUNITY): Payer: Self-pay | Admitting: Emergency Medicine

## 2018-04-21 ENCOUNTER — Ambulatory Visit (HOSPITAL_COMMUNITY)
Admission: EM | Admit: 2018-04-21 | Discharge: 2018-04-21 | Disposition: A | Discharge status: Home / Self Care | Payer: BLUE CROSS/BLUE SHIELD

## 2018-04-21 DIAGNOSIS — M25532 Pain in left wrist: Secondary | ICD-10-CM | POA: Diagnosis not present

## 2018-04-21 DIAGNOSIS — M25531 Pain in right wrist: Secondary | ICD-10-CM | POA: Diagnosis not present

## 2018-04-21 DIAGNOSIS — S30810A Abrasion of lower back and pelvis, initial encounter: Secondary | ICD-10-CM | POA: Diagnosis not present

## 2018-04-21 DIAGNOSIS — S30811A Abrasion of abdominal wall, initial encounter: Secondary | ICD-10-CM | POA: Diagnosis not present

## 2018-04-21 DIAGNOSIS — M791 Myalgia, unspecified site: Secondary | ICD-10-CM

## 2018-04-21 MED ORDER — CYCLOBENZAPRINE HCL 10 MG PO TABS: 10.0000 mg | ORAL_TABLET | Freq: Two times a day (BID) | ORAL | 0 refills | Status: DC | PRN

## 2018-04-21 MED ORDER — TRAMADOL HCL 50 MG PO TABS: 50.0000 mg | ORAL_TABLET | Freq: Four times a day (QID) | ORAL | 0 refills | Status: DC | PRN

## 2018-04-21 MED ORDER — NAPROXEN 500 MG PO TABS: 500.0000 mg | ORAL_TABLET | Freq: Two times a day (BID) | ORAL | 0 refills | Status: DC
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
## 2018-04-21 NOTE — Discharge Instructions (Addendum)
It was nice meeting you!!  I am sorry that you are going through this situation We will try a muscle relaxant and anti-inflammatory pain medication to help with your symptoms Be aware that the muscle relaxant will make you drowsy.  Not operate any heavy machinery while taking this medication We will give you a work note. Follow up as needed for continued or worsening symptoms

## 2019-03-28 ENCOUNTER — Other Ambulatory Visit: Payer: Self-pay

## 2019-03-28 DIAGNOSIS — Z20822 Contact with and (suspected) exposure to covid-19: Secondary | ICD-10-CM

## 2019-03-29 LAB — NOVEL CORONAVIRUS, NAA: SARS-CoV-2, NAA: NOT DETECTED

## 2019-06-06 ENCOUNTER — Other Ambulatory Visit: Payer: Self-pay

## 2019-06-06 ENCOUNTER — Ambulatory Visit (HOSPITAL_COMMUNITY)
Admission: EM | Admit: 2019-06-06 | Discharge: 2019-06-06 | Disposition: A | Discharge status: Home / Self Care | Payer: Self-pay | Attending: Emergency Medicine | Admitting: Emergency Medicine

## 2019-06-06 ENCOUNTER — Encounter (HOSPITAL_COMMUNITY): Payer: Self-pay

## 2019-06-06 DIAGNOSIS — M545 Low back pain, unspecified: Secondary | ICD-10-CM

## 2019-06-06 DIAGNOSIS — Z20828 Contact with and (suspected) exposure to other viral communicable diseases: Secondary | ICD-10-CM | POA: Insufficient documentation

## 2019-06-06 DIAGNOSIS — R52 Pain, unspecified: Secondary | ICD-10-CM | POA: Insufficient documentation

## 2019-06-06 MED ORDER — CYCLOBENZAPRINE HCL 5 MG PO TABS: 5.0000 mg | ORAL_TABLET | Freq: Two times a day (BID) | ORAL | 0 refills | Status: DC | PRN

## 2019-06-06 MED ORDER — IBUPROFEN 800 MG PO TABS: 800.0000 mg | ORAL_TABLET | Freq: Three times a day (TID) | ORAL | 0 refills | Status: DC

## 2019-06-06 NOTE — ED Triage Notes (Signed)
Pt states she has lower back pain. Pt states he was out in the rain this weekend trying to help catch his friend catch his dog.  Pt states he was in the rain for about about 45 minutes. Pt states he has lower back pain, right leg pain. and body aches. Pt states he needs a work note.

## 2019-06-06 NOTE — ED Provider Notes (Signed)
Center    CSN: [id number] Arrival date & time: 06/06/19  1440      History   Chief Complaint Chief Complaint  Patient presents with  . bodyaches    HPI Paul Yates is a 33 y.o. male no significant past medical history presenting today for evaluation of body aches.  Patient states that over the past few days he has had lower back pain as well as right leg pain.  He is felt very sore.  He states that his symptoms began after he was helping a neighbor catch their dog and was running for about 45 minutes in the rain.  He states that this activity is not atypical for him and typically is quite active.  He did have some mild URI symptoms of congestion and mild cough.  He denies known exposure to COVID.  Denies fevers or chills.  He has been missing work due to discomfort.  He has not taken any medicine for his pain.  Denies any nausea vomiting or abdominal pain.  HPI  Past Medical History:  Diagnosis Date  . Bulging disc     There are no active problems to display for this patient.   Past Surgical History:  Procedure Laterality Date  . LEG SURGERY         Home Medications    Prior to Admission medications   Medication Sig Start Date End Date Taking? Authorizing Provider  Aspirin-Acetaminophen (GOODYS BODY PAIN PO) Take by mouth.    [provider]  benzonatate (TESSALON) 100 MG capsule Take 1 capsule (100 mg total) by mouth every 8 (eight) hours. 02/12/17   Barnet Glasgow, NP  cyclobenzaprine (FLEXERIL) 5 MG tablet Take 1-2 tablets (5-10 mg total) by mouth 2 (two) times daily as needed for muscle spasms. 06/06/19   Wieters, Hallie C, PA-C  ibuprofen (ADVIL) 800 MG tablet Take 1 tablet (800 mg total) by mouth 3 (three) times daily. 06/06/19   Wieters, Hallie C, PA-C  naproxen (NAPROSYN) 500 MG tablet Take 1 tablet (500 mg total) by mouth 2 (two) times daily. 04/21/18   Loura Halt A, NP  ondansetron (ZOFRAN ODT) 4 MG disintegrating tablet Take 1  tablet (4 mg total) by mouth every 8 (eight) hours as needed for nausea or vomiting. 02/12/17   Barnet Glasgow, NP  phenol (CHLORASEPTIC) 1.4 % LIQD Use as directed 1 spray in the mouth or throat as needed for throat irritation / pain. 02/16/17   Tasia Catchings, Amy V, PA-C  traMADol (ULTRAM) 50 MG tablet Take 1 tablet (50 mg total) by mouth every 6 (six) hours as needed. 04/21/18   Orvan July, NP    Family History History reviewed. No pertinent family history.  Social History Social History   Tobacco Use  . Smoking status: Never Smoker  . Smokeless tobacco: Never Used  Substance Use Topics  . Alcohol use: No  . Drug use: No     Allergies   Lemon oil   Review of Systems Review of Systems  Constitutional: Negative for activity change, appetite change, chills, fatigue and fever.  HENT: Positive for rhinorrhea. Negative for congestion, ear pain, sinus pressure, sore throat and trouble swallowing.   Eyes: Negative for discharge and redness.  Respiratory: Positive for cough. Negative for chest tightness and shortness of breath.   Cardiovascular: Negative for chest pain.  Gastrointestinal: Negative for abdominal pain, diarrhea, nausea and vomiting.  Musculoskeletal: Positive for back pain and myalgias.  Skin: Negative for rash.  Neurological: Negative for dizziness, light-headedness and headaches.     Physical Exam Triage Vital Signs ED Triage Vitals  Enc Vitals Group     BP 06/06/19 1548 137/85     Pulse Rate 06/06/19 1548 77     Resp 06/06/19 1548 18     Temp 06/06/19 1548 98.9 F (37.2 C)     Temp Source 06/06/19 1548 Oral     SpO2 06/06/19 1548 100 %     Weight 06/06/19 1545 155 lb (70.3 kg)     Height --      Head Circumference --      Peak Flow --      Pain Score 06/06/19 1545 9     Pain Loc --      Pain Edu? --      Excl. in GC? --    No data found.  Updated Vital Signs BP 137/85 (BP Location: Right Arm)   Pulse 77   Temp 98.9 F (37.2 C) (Oral)   Resp 18    Wt 155 lb (70.3 kg)   SpO2 100%   BMI 21.02 kg/m   Visual Acuity Right Eye Distance:   Left Eye Distance:   Bilateral Distance:    Right Eye Near:   Left Eye Near:    Bilateral Near:     Physical Exam Vitals signs and nursing note reviewed.  Constitutional:      Appearance: He is well-developed.  HENT:     Head: Normocephalic and atraumatic.     Mouth/Throat:     Comments: Oral mucosa pink and moist, no tonsillar enlargement or exudate. Posterior pharynx patent and nonerythematous, no uvula deviation or swelling. Normal phonation.  Eyes:     Conjunctiva/sclera: Conjunctivae normal.  Neck:     Musculoskeletal: Neck supple.  Cardiovascular:     Rate and Rhythm: Normal rate and regular rhythm.     Heart sounds: No murmur.  Pulmonary:     Effort: Pulmonary effort is normal. No respiratory distress.     Breath sounds: Normal breath sounds.     Comments: Breathing comfortably at rest, CTABL, no wheezing, rales or other adventitious sounds auscultated Abdominal:     Palpations: Abdomen is soft.     Tenderness: There is no abdominal tenderness.  Musculoskeletal:     Comments: Nontender to palpation of cervical, thoracic and lumbar spine midline, increased tenderness to right lumbar musculature, negative straight leg raise  Strength 5/5 bilaterally at hips and knees  Gait without abnormality  Skin:    General: Skin is warm and dry.  Neurological:     Mental Status: He is alert.      UC Treatments / Results  Labs (all labs ordered are listed, but only abnormal results are displayed) Labs Reviewed  NOVEL CORONAVIRUS, NAA (HOSP ORDER, SEND-OUT TO REF LAB; TAT 18-24 HRS)    EKG   Radiology No results found.  Procedures Procedures (including critical care time)  Medications Ordered in UC Medications - No data to display  Initial Impression / Assessment and Plan / UC Course  I have reviewed the triage vital signs and the nursing notes.  Pertinent labs &  imaging results that were available during my care of the patient were reviewed by me and considered in my medical decision making (see chart for details).     Back pain seems muscular, no red flags for cauda equina.  Possibly related to activity over the weekend.  But given patient has had associated URI symptoms  that have been mild, will provide COVID testing to ensure patient may return back to work.  Recommending symptomatic and supportive care, ibuprofen.  Continue to monitor for resolution of symptoms,Discussed strict return precautions. Patient verbalized understanding and is agreeable with plan.  Final Clinical Impressions(s) / UC Diagnoses   Final diagnoses:  Body aches  Acute right-sided low back pain without sciatica     Discharge Instructions     COVID swab pending  Use anti-inflammatories for pain/swelling. You may take up to 800 mg Ibuprofen every 8 hours with food. You may supplement Ibuprofen with Tylenol [phone] mg every 8 hours.   Please follow-up if developing increased right calf pain, swelling or redness   ED Prescriptions    Medication Sig Dispense Auth. Provider   ibuprofen (ADVIL) 800 MG tablet Take 1 tablet (800 mg total) by mouth 3 (three) times daily. 21 tablet Wieters, Hallie C, PA-C   cyclobenzaprine (FLEXERIL) 5 MG tablet Take 1-2 tablets (5-10 mg total) by mouth 2 (two) times daily as needed for muscle spasms. 24 tablet Wieters, TexannaHallie C, PA-C     PDMP not reviewed this encounter.   Lew DawesWieters, Hallie C, [place]PA-C 06/06/19 2111

## 2019-06-06 NOTE — Discharge Instructions (Signed)
COVID swab pending  Use anti-inflammatories for pain/swelling. You may take up to 800 mg Ibuprofen every 8 hours with food. You may supplement Ibuprofen with Tylenol [phone] mg every 8 hours.   Please follow-up if developing increased right calf pain, swelling or redness

## 2019-06-07 LAB — NOVEL CORONAVIRUS, NAA (HOSP ORDER, SEND-OUT TO REF LAB; TAT 18-24 HRS): SARS-CoV-2, NAA: NOT DETECTED

## 2019-08-15 ENCOUNTER — Encounter (HOSPITAL_COMMUNITY): Payer: Self-pay

## 2019-08-15 ENCOUNTER — Ambulatory Visit (HOSPITAL_COMMUNITY)
Admission: EM | Admit: 2019-08-15 | Discharge: 2019-08-15 | Disposition: A | Discharge status: Home / Self Care | Payer: BC Managed Care – PPO | Attending: Family Medicine | Admitting: Family Medicine

## 2019-08-15 DIAGNOSIS — Z20828 Contact with and (suspected) exposure to other viral communicable diseases: Secondary | ICD-10-CM

## 2019-08-15 DIAGNOSIS — J40 Bronchitis, not specified as acute or chronic: Secondary | ICD-10-CM | POA: Insufficient documentation

## 2019-08-15 DIAGNOSIS — R05 Cough: Secondary | ICD-10-CM | POA: Diagnosis not present

## 2019-08-15 DIAGNOSIS — J069 Acute upper respiratory infection, unspecified: Secondary | ICD-10-CM | POA: Diagnosis not present

## 2019-08-15 DIAGNOSIS — Z20822 Contact with and (suspected) exposure to covid-19: Secondary | ICD-10-CM

## 2019-08-15 LAB — POC SARS CORONAVIRUS 2 AG: SARS Coronavirus 2 Ag: NEGATIVE

## 2019-08-15 LAB — POC SARS CORONAVIRUS 2 AG -  ED: SARS Coronavirus 2 Ag: NEGATIVE

## 2019-08-15 MED ORDER — CETIRIZINE HCL 10 MG PO CAPS: 10.0000 mg | ORAL_CAPSULE | Freq: Every day | ORAL | 0 refills | Status: AC

## 2019-08-15 MED ORDER — PREDNISONE 50 MG PO TABS: 50.0000 mg | ORAL_TABLET | Freq: Every day | ORAL | 0 refills | Status: AC

## 2019-08-15 MED ORDER — ALBUTEROL SULFATE HFA 108 (90 BASE) MCG/ACT IN AERS: 1.0000 | INHALATION_SPRAY | Freq: Four times a day (QID) | RESPIRATORY_TRACT | 0 refills | Status: AC | PRN

## 2019-08-15 MED ORDER — BENZONATATE 200 MG PO CAPS: 200.0000 mg | ORAL_CAPSULE | Freq: Three times a day (TID) | ORAL | 0 refills | Status: AC | PRN

## 2019-08-15 MED ORDER — HYDROCODONE-HOMATROPINE 5-1.5 MG/5ML PO SYRP: 5.0000 mL | ORAL_SOLUTION | Freq: Every evening | ORAL | 0 refills | Status: AC | PRN

## 2019-08-15 NOTE — ED Triage Notes (Signed)
Pt presents to the UC with cough x 3 days. Pt reports he has chest pain when he coughs. Pt states he is being hoarse x 1 day. Pt is taking Coricidin without relief.

## 2019-08-15 NOTE — ED Provider Notes (Signed)
[name]    CSN: [id number] Arrival date & time: 08/15/19  0907      History   Chief Complaint Chief Complaint  Patient presents with  . Cough  . Hoarse    HPI Paul Yates is a 33 y.o. male no contributing past medical history presenting today for evaluation of a cough.  Patient states that over the past 3 days he has had a cough.  Has associated chest discomfort while coughing.  Denies discomfort at rest.  Over the past 24 hours he has developed hoarseness and loss of voice.  He has felt his throat is very dry.  At times he has felt some wheezing.  Denies any known fevers chills or body aches.  Has had some sick contacts at work, but known specific known exposure to Covid.  Prior smoker of black and milds.  Denies current tobacco use.  Cough keeping patient up at night.  HPI  Past Medical History:  Diagnosis Date  . Bulging disc     There are no problems to display for this patient.   Past Surgical History:  Procedure Laterality Date  . LEG SURGERY         Home Medications    Prior to Admission medications   Medication Sig Start Date End Date Taking? Authorizing Provider  OVER THE COUNTER MEDICATION Coricidin   Yes [provider]  albuterol (VENTOLIN HFA) 108 (90 Base) MCG/ACT inhaler Inhale 1-2 puffs into the lungs every 6 (six) hours as needed for wheezing or shortness of breath. 08/15/19   Nasire Reali C, PA-C  benzonatate (TESSALON) 200 MG capsule Take 1 capsule (200 mg total) by mouth 3 (three) times daily as needed for up to 7 days for cough (daytime). 08/15/19 08/22/19  Donnah Levert C, PA-C  Cetirizine HCl 10 MG CAPS Take 1 capsule (10 mg total) by mouth daily. 08/15/19   Naomia Lenderman C, PA-C  HYDROcodone-homatropine (HYCODAN) 5-1.5 MG/5ML syrup Take 5 mLs by mouth at bedtime as needed for cough. 08/15/19   Ab Leaming C, PA-C  predniSONE (DELTASONE) 50 MG tablet Take 1 tablet (50 mg total) by mouth daily for 5 days.  08/15/19 08/20/19  Issac Moure, Junius CreamerHallie C, PA-C    Family History History reviewed. No pertinent family history.  Social History Social History   Tobacco Use  . Smoking status: Never Smoker  . Smokeless tobacco: Never Used  Substance Use Topics  . Alcohol use: No  . Drug use: No     Allergies   Lemon oil   Review of Systems Review of Systems  Constitutional: Positive for fatigue. Negative for activity change, appetite change, chills and fever.  HENT: Positive for voice change. Negative for congestion, ear pain, rhinorrhea, sinus pressure, sore throat and trouble swallowing.   Eyes: Negative for discharge and redness.  Respiratory: Positive for cough and wheezing. Negative for chest tightness and shortness of breath.   Cardiovascular: Negative for chest pain.  Gastrointestinal: Negative for abdominal pain, diarrhea, nausea and vomiting.  Musculoskeletal: Negative for myalgias.  Skin: Negative for rash.  Neurological: Negative for dizziness, light-headedness and headaches.     Physical Exam Triage Vital Signs ED Triage Vitals  Enc Vitals Group     BP 08/15/19 0925 134/88     Pulse Rate 08/15/19 0925 (!) 106     Resp 08/15/19 0925 18     Temp 08/15/19 0925 97.8 F (36.6 C)     Temp Source 08/15/19 0925 Oral  SpO2 08/15/19 0925 100 %     Weight --      Height --      Head Circumference --      Peak Flow --      Pain Score 08/15/19 0921 8     Pain Loc --      Pain Edu? --      Excl. in GC? --    No data found.  Updated Vital Signs BP 134/88 (BP Location: Right Arm)   Pulse (!) 106   Temp 97.8 F (36.6 C) (Oral)   Resp 18   SpO2 100%   Visual Acuity Right Eye Distance:   Left Eye Distance:   Bilateral Distance:    Right Eye Near:   Left Eye Near:    Bilateral Near:     Physical Exam Vitals and nursing note reviewed.  Constitutional:      Appearance: He is well-developed.  HENT:     Head: Normocephalic and atraumatic.     Ears:     Comments:  Bilateral ears without tenderness to palpation of external auricle, tragus and mastoid, EAC's without erythema or swelling, TM's with good bony landmarks and cone of light. Non erythematous.     Mouth/Throat:     Comments: Oral mucosa pink and moist, no tonsillar enlargement or exudate. Posterior pharynx patent and nonerythematous, no uvula deviation or swelling.  Voice hoarse Eyes:     Conjunctiva/sclera: Conjunctivae normal.  Cardiovascular:     Rate and Rhythm: Normal rate and regular rhythm.     Heart sounds: No murmur.  Pulmonary:     Effort: Pulmonary effort is normal. No respiratory distress.     Breath sounds: Normal breath sounds.     Comments: Breathing comfortably at rest, CTABL, faint end expiratory wheezing auscultated in bilateral upper lung fields, rales or other adventitious sounds auscultated Abdominal:     Palpations: Abdomen is soft.     Tenderness: There is no abdominal tenderness.  Musculoskeletal:     Cervical back: Neck supple.  Skin:    General: Skin is warm and dry.  Neurological:     General: No focal deficit present.     Mental Status: He is alert and oriented to person, place, and time.      UC Treatments / Results  Labs (all labs ordered are listed, but only abnormal results are displayed) Labs Reviewed  NOVEL CORONAVIRUS, NAA (HOSP ORDER, SEND-OUT TO REF LAB; TAT 18-24 HRS)  POC SARS CORONAVIRUS 2 AG -  ED  POC SARS CORONAVIRUS 2 AG    EKG   Radiology No results found.  Procedures Procedures (including critical care time)  Medications Ordered in UC Medications - No data to display  Initial Impression / Assessment and Plan / UC Course  I have reviewed the triage vital signs and the nursing notes.  Pertinent labs & imaging results that were available during my care of the patient were reviewed by me and considered in my medical decision making (see chart for details).     Covid point-of-care negative, PCR pending.  Faint wheezing  auscultated, will provide albuterol and short course of prednisone.  Given concerns around lack of sleep and nighttime cough will provide Hycodan for nighttime cough, Tessalon for daytime.  Daily cetirizine help with any drainage contributing to hoarseness.  Quarantine until [employer] results return.  Rest and push fluids.  Continue to monitor breathing, chest discomfort, most likely chest wall pain at this time.Discussed strict return precautions. Patient  verbalized understanding and is agreeable with plan.  Final Clinical Impressions(s) / UC Diagnoses   Final diagnoses:  Viral URI with cough  Bronchitis  Encounter for laboratory testing for COVID-19 virus     Discharge Instructions       Rapid COVID negative, COVID PCR pending- returns in approximately 2 day. Monitor MyChart, Quarantine until results return  Continue to rest and drink plenty of fluids. Albuterol inhaler 1 to 2 puffs every 4-6 hours as needed for shortness of breath, wheezing Begin prednisone daily for 5 days, take with food if you are able and in the morning Tessalon for cough during the day May use Hycodan cough syrup at bedtime to help with nighttime cough, do not drive or work after taking, use sparingly Daily cetirizine to help with postnasal drainage contributing to hoarseness  For throat try using a honey-based tea. Use 3 teaspoons of honey with juice squeezed from half lemon. Place shaved pieces of ginger into 1/2-1 cup of water and warm over stove top. Then mix the ingredients and repeat every 4 hours as needed.     Person Under Monitoring Name: JAG LENZ  Location: [address] [place] [address]   Infection Prevention Recommendations for Individuals Confirmed to have, or Being Evaluated for, 2019 Novel Coronavirus (COVID-19) Infection Who Receive Care at Home  Individuals who are confirmed to have, or are being evaluated for, COVID-19 should follow the prevention steps below until a  healthcare provider or local or state health department says they can return to normal activities.  Stay home except to get medical care You should restrict activities outside your home, except for getting medical care. Do not go to work, school, or public areas, and do not use public transportation or taxis.  Call ahead before visiting your doctor Before your medical appointment, call the healthcare provider and tell them that you have, or are being evaluated for, COVID-19 infection. This will help the healthcare provider's office take steps to keep other people from getting infected. Ask your healthcare provider to call the local or state health department.  Monitor your symptoms Seek prompt medical attention if your illness is worsening (e.g., difficulty breathing). Before going to your medical appointment, call the healthcare provider and tell them that you have, or are being evaluated for, COVID-19 infection. Ask your healthcare provider to call the local or state health department.  Wear a facemask You should wear a facemask that covers your nose and mouth when you are in the same room with other people and when you visit a healthcare provider. People who live with or visit you should also wear a facemask while they are in the same room with you.  Separate yourself from other people in your home As much as possible, you should stay in a different room from other people in your home. Also, you should use a separate bathroom, if available.  Avoid sharing household items You should not share dishes, drinking glasses, cups, eating utensils, towels, bedding, or other items with other people in your home. After using these items, you should wash them thoroughly with soap and water.  Cover your coughs and sneezes Cover your mouth and nose with a tissue when you cough or sneeze, or you can cough or sneeze into your sleeve. Throw used tissues in a lined trash can, and immediately wash  your hands with soap and water for at least 20 seconds or use an alcohol-based hand rub.  Wash your Proofreader  your hands often and thoroughly with soap and water for at least 20 seconds. You can use an alcohol-based hand sanitizer if soap and water are not available and if your hands are not visibly dirty. Avoid touching your eyes, nose, and mouth with unwashed hands.   Prevention Steps for Caregivers and Household Members of Individuals Confirmed to have, or Being Evaluated for, COVID-19 Infection Being Cared for in the Home  If you live with, or provide care at home for, a person confirmed to have, or being evaluated for, COVID-19 infection please follow these guidelines to prevent infection:  Follow healthcare provider's instructions Make sure that you understand and can help the patient follow any healthcare provider instructions for all care.  Provide for the patient's basic needs You should help the patient with basic needs in the home and provide support for getting groceries, prescriptions, and other personal needs.  Monitor the patient's symptoms If they are getting sicker, call his or her medical provider and tell them that the patient has, or is being evaluated for, COVID-19 infection. This will help the healthcare provider's office take steps to keep other people from getting infected. Ask the healthcare provider to call the local or state health department.  Limit the number of people who have contact with the patient  If possible, have only one caregiver for the patient.  Other household members should stay in another home or place of residence. If this is not possible, they should stay  in another room, or be separated from the patient as much as possible. Use a separate bathroom, if available.  Restrict visitors who do not have an essential need to be in the home.  Keep older adults, very young children, and other sick people away from the patient Keep older  adults, very young children, and those who have compromised immune systems or chronic health conditions away from the patient. This includes people with chronic heart, lung, or kidney conditions, diabetes, and cancer.  Ensure good ventilation Make sure that shared spaces in the home have good air flow, such as from an air conditioner or an opened window, weather permitting.  Wash your hands often  Wash your hands often and thoroughly with soap and water for at least 20 seconds. You can use an alcohol based hand sanitizer if soap and water are not available and if your hands are not visibly dirty.  Avoid touching your eyes, nose, and mouth with unwashed hands.  Use disposable paper towels to dry your hands. If not available, use dedicated cloth towels and replace them when they become wet.  Wear a facemask and gloves  Wear a disposable facemask at all times in the room and gloves when you touch or have contact with the patient's blood, body fluids, and/or secretions or excretions, such as sweat, saliva, sputum, nasal mucus, vomit, urine, or feces.  Ensure the mask fits over your nose and mouth tightly, and do not touch it during use.  Throw out disposable facemasks and gloves after using them. Do not reuse.  Wash your hands immediately after removing your facemask and gloves.  If your personal clothing becomes contaminated, carefully remove clothing and launder. Wash your hands after handling contaminated clothing.  Place all used disposable facemasks, gloves, and other waste in a lined container before disposing them with other household waste.  Remove gloves and wash your hands immediately after handling these items.  Do not share dishes, glasses, or other household items with the patient  Avoid  sharing household items. You should not share dishes, drinking glasses, cups, eating utensils, towels, bedding, or other items with a patient who is confirmed to have, or being evaluated for,  COVID-19 infection.  After the person uses these items, you should wash them thoroughly with soap and water.  Wash laundry thoroughly  Immediately remove and wash clothes or bedding that have blood, body fluids, and/or secretions or excretions, such as sweat, saliva, sputum, nasal mucus, vomit, urine, or feces, on them.  Wear gloves when handling laundry from the patient.  Read and follow directions on labels of laundry or clothing items and detergent. In general, wash and dry with the warmest temperatures recommended on the label.  Clean all areas the individual has used often  Clean all touchable surfaces, such as counters, tabletops, doorknobs, bathroom fixtures, toilets, phones, keyboards, tablets, and bedside tables, every day. Also, clean any surfaces that may have blood, body fluids, and/or secretions or excretions on them.  Wear gloves when cleaning surfaces the patient has come in contact with.  Use a diluted bleach solution (e.g., dilute bleach with 1 part bleach and 10 parts water) or a household disinfectant with a label that says EPA-registered for coronaviruses. To make a bleach solution at home, add 1 tablespoon of bleach to 1 quart (4 cups) of water. For a larger supply, add  cup of bleach to 1 gallon (16 cups) of water.  Read labels of cleaning products and follow recommendations provided on product labels. Labels contain instructions for safe and effective use of the cleaning product including precautions you should take when applying the product, such as wearing gloves or eye protection and making sure you have good ventilation during use of the product.  Remove gloves and wash hands immediately after cleaning.  Monitor yourself for signs and symptoms of illness Caregivers and household members are considered close contacts, should monitor their health, and will be asked to limit movement outside of the home to the extent possible. Follow the monitoring steps for close  contacts listed on the symptom monitoring form.   ? If you have additional questions, contact your local health department or call the epidemiologist on call at [phone] (available 24/7). ? This guidance is subject to change. For the most up-to-date guidance from [facility], please refer to their website: [email or web address]     ED Prescriptions    Medication Sig Dispense Auth. Provider   albuterol (VENTOLIN HFA) 108 (90 Base) MCG/ACT inhaler Inhale 1-2 puffs into the lungs every 6 (six) hours as needed for wheezing or shortness of breath. 8 g Lamica Mccart C, PA-C   benzonatate (TESSALON) 200 MG capsule Take 1 capsule (200 mg total) by mouth 3 (three) times daily as needed for up to 7 days for cough (daytime). 28 capsule Briella Hobday C, PA-C   HYDROcodone-homatropine (HYCODAN) 5-1.5 MG/5ML syrup Take 5 mLs by mouth at bedtime as needed for cough. 70 mL Karsten Vaughn C, PA-C   Cetirizine HCl 10 MG CAPS Take 1 capsule (10 mg total) by mouth daily. 15 capsule Nalda Shackleford C, PA-C   predniSONE (DELTASONE) 50 MG tablet Take 1 tablet (50 mg total) by mouth daily for 5 days. 5 tablet Neal Oshea, [place] C, PA-C     I have reviewed the PDMP during this encounter.   Lew Dawes, PA-C 08/15/19 1013

## 2019-08-15 NOTE — Discharge Instructions (Signed)
Rapid COVID negative, COVID PCR pending- returns in approximately 2 day. Monitor MyChart, Quarantine until results return  Continue to rest and drink plenty of fluids. Albuterol inhaler 1 to 2 puffs every 4-6 hours as needed for shortness of breath, wheezing Begin prednisone daily for 5 days, take with food if you are able and in the morning Tessalon for cough during the day May use Hycodan cough syrup at bedtime to help with nighttime cough, do not drive or work after taking, use sparingly Daily cetirizine to help with postnasal drainage contributing to hoarseness  For throat try using a honey-based tea. Use 3 teaspoons of honey with juice squeezed from half lemon. Place shaved pieces of ginger into 1/2-1 cup of water and warm over stove top. Then mix the ingredients and repeat every 4 hours as needed.     Person Under Monitoring Name: Paul Yates  Location: [address] [place] [address]   Infection Prevention Recommendations for Individuals Confirmed to have, or Being Evaluated for, 2019 Novel Coronavirus (COVID-19) Infection Who Receive Care at Home  Individuals who are confirmed to have, or are being evaluated for, COVID-19 should follow the prevention steps below until a healthcare provider or local or state health department says they can return to normal activities.  Stay home except to get medical care You should restrict activities outside your home, except for getting medical care. Do not go to work, school, or public areas, and do not use public transportation or taxis.  Call ahead before visiting your doctor Before your medical appointment, call the healthcare provider and tell them that you have, or are being evaluated for, COVID-19 infection. This will help the healthcare provider's office take steps to keep other people from getting infected. Ask your healthcare provider to call the local or state health department.  Monitor your symptoms Seek prompt  medical attention if your illness is worsening (e.g., difficulty breathing). Before going to your medical appointment, call the healthcare provider and tell them that you have, or are being evaluated for, COVID-19 infection. Ask your healthcare provider to call the local or state health department.  Wear a facemask You should wear a facemask that covers your nose and mouth when you are in the same room with other people and when you visit a healthcare provider. People who live with or visit you should also wear a facemask while they are in the same room with you.  Separate yourself from other people in your home As much as possible, you should stay in a different room from other people in your home. Also, you should use a separate bathroom, if available.  Avoid sharing household items You should not share dishes, drinking glasses, cups, eating utensils, towels, bedding, or other items with other people in your home. After using these items, you should wash them thoroughly with soap and water.  Cover your coughs and sneezes Cover your mouth and nose with a tissue when you cough or sneeze, or you can cough or sneeze into your sleeve. Throw used tissues in a lined trash can, and immediately wash your hands with soap and water for at least 20 seconds or use an alcohol-based hand rub.  Wash your [employer] your hands often and thoroughly with soap and water for at least 20 seconds. You can use an alcohol-based hand sanitizer if soap and water are not available and if your hands are not visibly dirty. Avoid touching your eyes, nose, and mouth with unwashed  hands.   Prevention Steps for Caregivers and Household Members of Individuals Confirmed to have, or Being Evaluated for, COVID-19 Infection Being Cared for in the Home  If you live with, or provide care at home for, a person confirmed to have, or being evaluated for, COVID-19 infection please follow these guidelines to prevent  infection:  Follow healthcare provider's instructions Make sure that you understand and can help the patient follow any healthcare provider instructions for all care.  Provide for the patient's basic needs You should help the patient with basic needs in the home and provide support for getting groceries, prescriptions, and other personal needs.  Monitor the patient's symptoms If they are getting sicker, call his or her medical provider and tell them that the patient has, or is being evaluated for, COVID-19 infection. This will help the healthcare provider's office take steps to keep other people from getting infected. Ask the healthcare provider to call the local or state health department.  Limit the number of people who have contact with the patient If possible, have only one caregiver for the patient. Other household members should stay in another home or place of residence. If this is not possible, they should stay in another room, or be separated from the patient as much as possible. Use a separate bathroom, if available. Restrict visitors who do not have an essential need to be in the home.  Keep older adults, very young children, and other sick people away from the patient Keep older adults, very young children, and those who have compromised immune systems or chronic health conditions away from the patient. This includes people with chronic heart, lung, or kidney conditions, diabetes, and cancer.  Ensure good ventilation Make sure that shared spaces in the home have good air flow, such as from an air conditioner or an opened window, weather permitting.  Wash your hands often Wash your hands often and thoroughly with soap and water for at least 20 seconds. You can use an alcohol based hand sanitizer if soap and water are not available and if your hands are not visibly dirty. Avoid touching your eyes, nose, and mouth with unwashed hands. Use disposable paper towels to dry your  hands. If not available, use dedicated cloth towels and replace them when they become wet.  Wear a facemask and gloves Wear a disposable facemask at all times in the room and gloves when you touch or have contact with the patient's blood, body fluids, and/or secretions or excretions, such as sweat, saliva, sputum, nasal mucus, vomit, urine, or feces.  Ensure the mask fits over your nose and mouth tightly, and do not touch it during use. Throw out disposable facemasks and gloves after using them. Do not reuse. Wash your hands immediately after removing your facemask and gloves. If your personal clothing becomes contaminated, carefully remove clothing and launder. Wash your hands after handling contaminated clothing. Place all used disposable facemasks, gloves, and other waste in a lined container before disposing them with other household waste. Remove gloves and wash your hands immediately after handling these items.  Do not share dishes, glasses, or other household items with the patient Avoid sharing household items. You should not share dishes, drinking glasses, cups, eating utensils, towels, bedding, or other items with a patient who is confirmed to have, or being evaluated for, COVID-19 infection. After the person uses these items, you should wash them thoroughly with soap and water.  Wash laundry thoroughly Immediately remove and wash clothes or bedding  that have blood, body fluids, and/or secretions or excretions, such as sweat, saliva, sputum, nasal mucus, vomit, urine, or feces, on them. Wear gloves when handling laundry from the patient. Read and follow directions on labels of laundry or clothing items and detergent. In general, wash and dry with the warmest temperatures recommended on the label.  Clean all areas the individual has used often Clean all touchable surfaces, such as counters, tabletops, doorknobs, bathroom fixtures, toilets, phones, keyboards, tablets, and bedside tables,  every day. Also, clean any surfaces that may have blood, body fluids, and/or secretions or excretions on them. Wear gloves when cleaning surfaces the patient has come in contact with. Use a diluted bleach solution (e.g., dilute bleach with 1 part bleach and 10 parts water) or a household disinfectant with a label that says EPA-registered for coronaviruses. To make a bleach solution at home, add 1 tablespoon of bleach to 1 quart (4 cups) of water. For a larger supply, add  cup of bleach to 1 gallon (16 cups) of water. Read labels of cleaning products and follow recommendations provided on product labels. Labels contain instructions for safe and effective use of the cleaning product including precautions you should take when applying the product, such as wearing gloves or eye protection and making sure you have good ventilation during use of the product. Remove gloves and wash hands immediately after cleaning.  Monitor yourself for signs and symptoms of illness Caregivers and household members are considered close contacts, should monitor their health, and will be asked to limit movement outside of the home to the extent possible. Follow the monitoring steps for close contacts listed on the symptom monitoring form.   ? If you have additional questions, contact your local health department or call the epidemiologist on call at [phone] (available 24/7). ? This guidance is subject to change. For the most up-to-date guidance from [facility], please refer to their website: [email or web address]

## 2019-08-17 LAB — NOVEL CORONAVIRUS, NAA (HOSP ORDER, SEND-OUT TO REF LAB; TAT 18-24 HRS): SARS-CoV-2, NAA: NOT DETECTED

## 2020-09-10 DIAGNOSIS — Z20828 Contact with and (suspected) exposure to other viral communicable diseases: Secondary | ICD-10-CM | POA: Diagnosis not present

## 2020-09-10 DIAGNOSIS — R0602 Shortness of breath: Secondary | ICD-10-CM | POA: Diagnosis not present

## 2020-09-13 DIAGNOSIS — B349 Viral infection, unspecified: Secondary | ICD-10-CM | POA: Diagnosis not present

## 2020-09-13 DIAGNOSIS — U071 COVID-19: Secondary | ICD-10-CM | POA: Diagnosis not present

## 2020-09-13 DIAGNOSIS — J069 Acute upper respiratory infection, unspecified: Secondary | ICD-10-CM | POA: Diagnosis not present

## 2020-09-29 ENCOUNTER — Ambulatory Visit (HOSPITAL_COMMUNITY)
Admission: EM | Admit: 2020-09-29 | Discharge: 2020-09-29 | Disposition: A | Discharge status: Home / Self Care | Payer: BC Managed Care – PPO | Attending: Urgent Care | Admitting: Urgent Care

## 2020-09-29 ENCOUNTER — Encounter (HOSPITAL_COMMUNITY): Payer: Self-pay

## 2020-09-29 ENCOUNTER — Other Ambulatory Visit: Payer: Self-pay

## 2020-09-29 ENCOUNTER — Ambulatory Visit (HOSPITAL_COMMUNITY): Payer: BC Managed Care – PPO

## 2020-09-29 DIAGNOSIS — R519 Headache, unspecified: Secondary | ICD-10-CM

## 2020-09-29 DIAGNOSIS — R0789 Other chest pain: Secondary | ICD-10-CM

## 2020-09-29 DIAGNOSIS — Z8616 Personal history of COVID-19: Secondary | ICD-10-CM

## 2020-09-29 HISTORY — DX: Bronchitis, not specified as acute or chronic: J40

## 2020-09-29 MED ORDER — TIZANIDINE HCL 4 MG PO TABS: 4.0000 mg | ORAL_TABLET | Freq: Every day | ORAL | 0 refills | Status: AC

## 2020-09-29 NOTE — Discharge Instructions (Signed)
Do not use any nonsteroidal anti-inflammatories (NSAIDs) like ibuprofen, Motrin, naproxen, Aleve, Goody Powders etc. which are all available over-the-counter.  Please just use Tylenol at a dose of 500mg -650mg  once every 6 hours as needed for your aches, pains, fevers. It is okay to use Tylenol and tizanidine, a muscle relaxant, together for your chest pain. You are going to get a consultation with a heart doctor and [facility] will reach out to you to set up a regular doctor, PCP.

## 2020-09-29 NOTE — ED Provider Notes (Signed)
[facility]   MRN: [id number] DOB: Apr 13, 1986  Subjective:   Paul Yates is a 35 y.o. male presenting for 53-month history of persistent left-sided chest pain worse in the past month.  Patient did have COVID 19 4 weeks ago.  Feels like he is recovered from this but is still concerned about his worsening chest pain.  Denies shortness of breath, cough.  He did also have body aches and nasal congestion when he had COVID.  He was prescribed Afrin and has been using Goody powders consistently for his headaches.  Denies any active headache, confusion, sore throat, cough, active chest pain or shortness of breath.  Patient reports a remote history of a blood clot when he was a child but does not remember the nature of this.  He denies being on chronic anticoagulation.  Denies a history of asthma.  States he has gotten bronchitis in the past but feels like this is very different.  He does work for a [employer], states that is very demanding work, does night shift, performs strenuous fast-paced heavy lifting and is on his feet all day.  No current facility-administered medications for this encounter.  Current Outpatient Medications:  .  albuterol (VENTOLIN HFA) 108 (90 Base) MCG/ACT inhaler, Inhale 1-2 puffs into the lungs every 6 (six) hours as needed for wheezing or shortness of breath., Disp: 8 g, Rfl: 0 .  Cetirizine HCl 10 MG CAPS, Take 1 capsule (10 mg total) by mouth daily., Disp: 15 capsule, Rfl: 0 .  HYDROcodone-homatropine (HYCODAN) 5-1.5 MG/5ML syrup, Take 5 mLs by mouth at bedtime as needed for cough., Disp: 70 mL, Rfl: 0 .  OVER THE COUNTER MEDICATION, Coricidin, Disp: , Rfl:    Allergies  Allergen Reactions  . Lemon Oil Anaphylaxis    Past Medical History:  Diagnosis Date  . Bulging disc      Past Surgical History:  Procedure Laterality Date  . LEG SURGERY      No family history on file.  Social History   Tobacco Use  . Smoking status: Never Smoker   . Smokeless tobacco: Never Used  Substance Use Topics  . Alcohol use: No  . Drug use: No    ROS   Objective:   Vitals: BP 122/73   Pulse 82   Resp 19   SpO2 100%   Physical Exam Constitutional:      General: He is not in acute distress.    Appearance: Normal appearance. He is well-developed. He is not ill-appearing, toxic-appearing or diaphoretic.  HENT:     Head: Normocephalic and atraumatic.     Right Ear: External ear normal.     Left Ear: External ear normal.     Nose: Nose normal.     Mouth/Throat:     Mouth: Mucous membranes are moist.     Pharynx: Oropharynx is clear.  Eyes:     General: No scleral icterus.       Right eye: No discharge.        Left eye: No discharge.     Extraocular Movements: Extraocular movements intact.     Conjunctiva/sclera: Conjunctivae normal.     Pupils: Pupils are equal, round, and reactive to light.  Cardiovascular:     Rate and Rhythm: Normal rate and regular rhythm.     Heart sounds: Normal heart sounds. No murmur heard. No friction rub. No gallop.   Pulmonary:     Effort: Pulmonary effort is normal. No respiratory  distress.     Breath sounds: Normal breath sounds. No stridor. No wheezing, rhonchi or rales.  Neurological:     Mental Status: He is alert and oriented to person, place, and time.  Psychiatric:        Mood and Affect: Mood normal.        Behavior: Behavior normal.        Thought Content: Thought content normal.        Judgment: Judgment normal.    ED ECG REPORT   Date: 09/29/2020  Rate: 78bpm  Rhythm: normal sinus rhythm  QRS Axis: normal  Intervals: normal  ST/T Wave abnormalities: normal  Conduction Disutrbances:Incomplete RBBB  Narrative Interpretation: Sinus rhythm at 78bpm with incomplete RBBB. Comparable to previous ecg. No S1Q3T3. No acute findings.   Old EKG Reviewed: changes noted  I have personally reviewed the EKG tracing and agree with the computerized printout as noted.  Assessment and  Plan :   PDMP not reviewed this encounter.  1. Atypical chest pain   2. Generalized headaches   3. History of COVID-19     Emphasized need to stop using Afrin and Goody powders for his headaches.  Suspect that his chest pain may be musculoskeletal in nature given his strenuous work activities.  Patient refused chest x-ray and I am in agreement given clear cardiopulmonary exam, no reproducible chest pain on exam, lack of trauma.  Discussed that the appropriate diagnostic test for a chest clot is a CT scan.  I do not suspect that patient is having this at this time given lack of shortness of breath, tachycardia and despite chest pain is more in line with musculoskeletal source.  Ultimately his EKG is abnormal and I placed a referral to [facility] cardiology.  Also placed in a new PCP assistance program to establish care with new provider.  We will have patient use Tylenol for aches and pains, tizanidine as a muscle relaxant, avoid NSAID use. Counseled patient on potential for adverse effects with medications prescribed/recommended today, strict ER and return-to-clinic precautions discussed, patient verbalized understanding.    Wallis Bamberg, [place] 09/29/20 1342

## 2020-09-29 NOTE — ED Triage Notes (Addendum)
Pt in with c/o headache and sharp left chest pain that has been going on for a few weeks now.States the pain usually happen when he is at work. Also c/o leg pain  Pt states he was covid positive 4 weeks ago.   Pain radiates to his shoulder and arm pit  Denies dizziness, vision changes, vomiting  Pt states that stretching and taking deep breaths helps with sxs along with ibuprofen and goodie powder

## 2020-10-02 DIAGNOSIS — D649 Anemia, unspecified: Secondary | ICD-10-CM | POA: Diagnosis not present

## 2020-10-02 DIAGNOSIS — U071 COVID-19: Secondary | ICD-10-CM | POA: Diagnosis not present

## 2020-10-03 DIAGNOSIS — Z20822 Contact with and (suspected) exposure to covid-19: Secondary | ICD-10-CM | POA: Diagnosis not present

## 2020-10-04 ENCOUNTER — Ambulatory Visit: Payer: BC Managed Care – PPO | Admitting: Cardiovascular Disease

## 2020-10-04 ENCOUNTER — Telehealth: Payer: Self-pay | Admitting: Cardiovascular Disease

## 2020-10-04 ENCOUNTER — Other Ambulatory Visit: Payer: Self-pay | Admitting: Family Medicine

## 2020-10-04 DIAGNOSIS — M79661 Pain in right lower leg: Secondary | ICD-10-CM

## 2020-10-04 NOTE — Telephone Encounter (Signed)
FYI--Dana with [facility] requested to make Dr. Flora Lipps aware that their office sent an order to [facility] for a venus doppler of the right lower extremity. If questions, return call to Annabelle Harman to discuss at [phone].

## 2020-10-04 NOTE — Progress Notes (Deleted)
Cardiology Office Note:   Date:  10/04/2020  NAME:  Paul Yates    MRN: [id number] DOB:  1985/12/16   PCP:  Patient, No Pcp Per  Cardiologist:  No primary care provider on file.  Electrophysiologist:  None   Referring MD: Wallis Bamberg, PA-C   No chief complaint on file. ***  History of Present Illness:   Paul Yates is a 35 y.o. male with a hx of bronchitis who is being seen today for the evaluation of chest pain at the request of Wallis Bamberg, [place]. Seen in ER 10/02/2020 for atypical CP. EKG with incomplete RBBB which is a normal finding.  Past Medical History: Past Medical History:  Diagnosis Date  . Bronchitis   . Bulging disc     Past Surgical History: Past Surgical History:  Procedure Laterality Date  . LEG SURGERY      Current Medications: No outpatient medications have been marked as taking for the 10/04/20 encounter (Appointment) with O'Neal, Ronnald Ramp, MD.     Allergies:    Larkin Ina   Social History: Social History   Socioeconomic History  . Marital status: Single    Spouse name: Not on file  . Number of children: Not on file  . Years of education: Not on file  . Highest education level: Not on file  Occupational History  . Not on file  Tobacco Use  . Smoking status: Never Smoker  . Smokeless tobacco: Never Used  Substance and Sexual Activity  . Alcohol use: No  . Drug use: No  . Sexual activity: Yes  Other Topics Concern  . Not on file  Social History Narrative  . Not on file   Social Determinants of Health   Financial Resource Strain: Not on file  Food Insecurity: Not on file  Transportation Needs: Not on file  Physical Activity: Not on file  Stress: Not on file  Social Connections: Not on file     Family History: The patient's ***family history is not on file.  ROS:   All other ROS reviewed and negative. Pertinent positives noted in the HPI.     EKGs/Labs/Other Studies Reviewed:   The following studies were personally  reviewed by me today:  EKG:  EKG is *** ordered today.  The ekg ordered today demonstrates ***, and was personally reviewed by me.   Recent Labs: No results found for requested labs within last 8760 hours.   Recent Lipid Panel No results found for: CHOL, TRIG, HDL, CHOLHDL, VLDL, LDLCALC, LDLDIRECT  Physical Exam:   VS:  There were no vitals taken for this visit.   Wt Readings from Last 3 Encounters:  06/06/19 155 lb (70.3 kg)  04/21/18 150 lb (68 kg)  06/07/16 150 lb (68 kg)    General: Well nourished, well developed, in no acute distress Head: Atraumatic, normal size  Eyes: PEERLA, EOMI  Neck: Supple, no JVD Endocrine: No thryomegaly Cardiac: Normal S1, S2; RRR; no murmurs, rubs, or gallops Lungs: Clear to auscultation bilaterally, no wheezing, rhonchi or rales  Abd: Soft, nontender, no hepatomegaly  Ext: No edema, pulses 2+ Musculoskeletal: No deformities, BUE and BLE strength normal and equal Skin: Warm and dry, no rashes   Neuro: Alert and oriented to person, place, time, and situation, CNII-XII grossly intact, no focal deficits  Psych: Normal mood and affect   ASSESSMENT:   Paul Yates is a 35 y.o. male who presents for the following: No diagnosis found.  PLAN:  There are no diagnoses linked to this encounter.  Disposition: No follow-ups on file.  Medication Adjustments/Labs and Tests Ordered: Current medicines are reviewed at length with the patient today.  Concerns regarding medicines are outlined above.  No orders of the defined types were placed in this encounter.  No orders of the defined types were placed in this encounter.   There are no Patient Instructions on file for this visit.   Time Spent with Patient: I have spent a total of *** minutes with patient reviewing hospital notes, telemetry, EKGs, labs and examining the patient as well as establishing an assessment and plan that was discussed with the patient.  > 50% of time was spent in direct  patient care.  Signed, Lenna Gilford. Flora Lipps, MD [facility]  [address] [place], [place] [address] [phone]  10/04/2020 6:40 AM

## 2020-10-08 ENCOUNTER — Telehealth: Payer: Self-pay | Admitting: Cardiovascular Disease

## 2020-10-08 NOTE — Telephone Encounter (Signed)
    Pt called about no show, sent staff message to Erskine Squibb and advised pt will not be billed for no show.   Arrie Senate, Angeline S He only received a letter. He has not been billed. I'll make a note so I won't bill him. I have not ran the report that would include that date yet.   Rhonda  Patient Accounts

## 2020-10-09 ENCOUNTER — Ambulatory Visit
Admission: RE | Admit: 2020-10-09 | Discharge: 2020-10-09 | Disposition: A | Discharge status: Home / Self Care | Payer: BC Managed Care – PPO | Source: Ambulatory Visit | Attending: Family Medicine | Admitting: Family Medicine

## 2020-10-09 ENCOUNTER — Other Ambulatory Visit: Payer: Self-pay | Admitting: Family Medicine

## 2020-10-09 DIAGNOSIS — Z8616 Personal history of COVID-19: Secondary | ICD-10-CM

## 2020-10-09 DIAGNOSIS — M79661 Pain in right lower leg: Secondary | ICD-10-CM | POA: Diagnosis not present

## 2020-10-09 DIAGNOSIS — R0789 Other chest pain: Secondary | ICD-10-CM

## 2020-10-09 DIAGNOSIS — R079 Chest pain, unspecified: Secondary | ICD-10-CM | POA: Diagnosis not present

## 2020-10-17 ENCOUNTER — Ambulatory Visit (INDEPENDENT_AMBULATORY_CARE_PROVIDER_SITE_OTHER): Payer: BC Managed Care – PPO | Admitting: Cardiology

## 2020-10-17 ENCOUNTER — Other Ambulatory Visit: Payer: Self-pay

## 2020-10-17 ENCOUNTER — Encounter: Payer: Self-pay | Admitting: Cardiology

## 2020-10-17 VITALS — BP 118/82 | HR 80 | Ht 72.0 in | Wt 147.8 lb

## 2020-10-17 DIAGNOSIS — R072 Precordial pain: Secondary | ICD-10-CM

## 2020-10-17 DIAGNOSIS — R079 Chest pain, unspecified: Secondary | ICD-10-CM | POA: Diagnosis not present

## 2020-10-17 DIAGNOSIS — R0600 Dyspnea, unspecified: Secondary | ICD-10-CM

## 2020-10-17 DIAGNOSIS — R0609 Other forms of dyspnea: Secondary | ICD-10-CM

## 2020-10-17 LAB — BASIC METABOLIC PANEL
BUN/Creatinine Ratio: 14 (ref 9–20)
BUN: 11 mg/dL (ref 6–20)
CO2: 31 mmol/L — ABNORMAL HIGH (ref 20–29)
Calcium: 9.8 mg/dL (ref 8.7–10.2)
Chloride: 102 mmol/L (ref 96–106)
Creatinine, Ser: 0.8 mg/dL (ref 0.76–1.27)
GFR calc Af Amer: 135 mL/min/{1.73_m2} (ref >59)
GFR calc non Af Amer: 117 mL/min/{1.73_m2} (ref >59)
Glucose: 91 mg/dL (ref 65–99)
Potassium: 4.3 mmol/L (ref 3.5–5.2)
Sodium: 140 mmol/L (ref 134–144)

## 2020-10-17 LAB — TROPONIN T: Troponin T (Highly Sensitive): 8 ng/L (ref 0–22)

## 2020-10-17 LAB — D-DIMER, QUANTITATIVE: D-DIMER: <0.2 mg/L FEU (ref 0.00–0.49)

## 2020-10-17 LAB — C-REACTIVE PROTEIN: CRP: <1 mg/L (ref 0–10)

## 2020-10-17 LAB — SEDIMENTATION RATE: Sed Rate: 2 mm/hr (ref 0–15)

## 2020-10-17 NOTE — Progress Notes (Signed)
Cardiology Consult Note    Date:  10/17/2020   ID:  JEFFERSON FULLAM, DOB 1986/02/17, MRN [id number]  PCP:  Patient, No Pcp Per  Cardiologist:  Fransico Him, MD   Chief Complaint  Patient presents with  . New Patient (Initial Visit)    Chest pain and DOE    History of Present Illness:  Paul Yates is a 35 y.o. male who is being seen today for the evaluation of chest pain at the request of Jaynee Eagles, [place].  This is a 35yo male with a history bronchitis who started having chest pain back in November.  He was at work and Arch[employer] and started noticing it at work.  The first time it was a sharp pain and could not take a breath in because his chest was tight and worse with deep breathing.  It radiated into his neck and left shoulder.  It only lasted a few seconds and resolved.  He took NSAIDs for it.    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  He says that how he is getting it during sleep with sharp pain going through his chest and felt like a pulled muscle which resolves when stretching out but then comes.  He says that now it is there constantly and never goes away and is now taking Motrin all the time.  He has a hx of DVT in right leg in 2007 and tells me that he had surgery to take the clot out. He tells me that he has problems with SOB with exertion and now feels SOB all the time.  He had COVID 19 in January and SOB has persisted and chest pain became enhanced after COVID 19 and is losing weight.  He feels at times that he has LE edema if he has been on his feet for a while.  He notices his heart rate is now faster as well. 12 lead EKG done earlier this month showed NSR with iRBBB and early repol.   Past Medical History:  Diagnosis Date  . Bronchitis   . Bulging disc   . Chest pain     Past Surgical History:  Procedure Laterality Date  . LEG SURGERY      Current Medications: Current Meds  Medication Sig  . albuterol (VENTOLIN HFA)  108 (90 Base) MCG/ACT inhaler Inhale 1-2 puffs into the lungs every 6 (six) hours as needed for wheezing or shortness of breath.  . Aspirin-Acetaminophen-Caffeine (GOODY HEADACHE PO) Take 1 each by mouth as needed.  . Cetirizine HCl 10 MG CAPS Take 1 capsule (10 mg total) by mouth daily.  . ferrous sulfate 324 MG TBEC Take 324 mg by mouth daily.  Marland Kitchen HYDROcodone-homatropine (HYCODAN) 5-1.5 MG/5ML syrup Take 5 mLs by mouth at bedtime as needed for cough.  Marland Kitchen OVER THE COUNTER MEDICATION Coricidin  . tiZANidine (ZANAFLEX) 4 MG tablet Take 1 tablet (4 mg total) by mouth at bedtime.  . vitamin C (ASCORBIC ACID) 500 MG tablet Take 500 mg by mouth daily.    Allergies:   Lemon oil   Social History   Socioeconomic History  . Marital status: Single    Spouse name: Not on file  . Number of children: Not on file  . Years of education: Not on file  . Highest education level: Not on file  Occupational History  . Not on file  Tobacco Use  . Smoking status: Never Smoker  .  Smokeless tobacco: Never Used  Substance and Sexual Activity  . Alcohol use: No  . Drug use: No  . Sexual activity: Yes  Other Topics Concern  . Not on file  Social History Narrative  . Not on file   Social Determinants of Health   Financial Resource Strain: Not on file  Food Insecurity: Not on file  Transportation Needs: Not on file  Physical Activity: Not on file  Stress: Not on file  Social Connections: Not on file     Family History:  The patient's family history includes Diabetes in his mother; Diverticulitis in his mother; Hypertension in his mother.   ROS:   Please see the history of present illness.    ROS All other systems reviewed and are negative.    PHYSICAL EXAM:   VS:  BP 118/82   Pulse 80   Ht 6' (1.829 m)   Wt 147 lb 12.8 oz (67 kg)   SpO2 98%   BMI 20.05 kg/m    GEN: Well nourished, well developed, in no acute distress  HEENT: normal  Neck: no JVD, carotid bruits, or masses Cardiac: RRR;  no murmurs, rubs, or gallops,no edema.  Intact distal pulses bilaterally.  Respiratory:  clear to auscultation bilaterally, normal work of breathing GI: soft, nontender, nondistended, + BS MS: no deformity or atrophy  Skin: warm and dry, no rash Neuro:  Alert and Oriented x 3, Strength and sensation are intact Psych: euthymic mood, full affect  Wt Readings from Last 3 Encounters:  10/17/20 147 lb 12.8 oz (67 kg)  06/06/19 155 lb (70.3 kg)  04/21/18 150 lb (68 kg)      Studies/Labs Reviewed:   EKG:  EKG is not ordered today.   Recent Labs: No results found for requested labs within last 8760 hours.   Lipid Panel No results found for: CHOL, TRIG, HDL, CHOLHDL, VLDL, LDLCALC, LDLDIRECT   Additional studies/ records that were reviewed today include: EKG  ASSESSMENT:    1. Chest pain of uncertain etiology   2. DOE (dyspnea on exertion)      PLAN:  In order of problems listed above:  1. Chest pain -this is pleuritic and sharp in nature ? MSK but he has had recent COVID 19 making clotting disorder more concerning especially with hx of DVT in the past.  Also need to consider myopericarditis given recent COVID 19 -will get stat ddimer, hsTrop, CRP, ESR -stat chest CTA to rule out acute PE>>will also be able to assess for underlying lung sequelae of recent COVID 19 infection -if above tests are normal then will get an ETT  2.  DOE -? Residual from COVID 19 infection -check Chest CT to eval for ground glass opacities seen in recent COVID 19 infection -check 2D echo to assess LVF    Medication Adjustments/Labs and Tests Ordered: Current medicines are reviewed at length with the patient today.  Concerns regarding medicines are outlined above.  Medication changes, Labs and Tests ordered today are listed in the Patient Instructions below.  There are no Patient Instructions on file for this visit.   Signed, Fransico Him, MD  10/17/2020 10:36 AM    [facility] [place], [place], [place]  [address] Phone: [phone]; Fax: [phone]

## 2020-10-17 NOTE — Patient Instructions (Addendum)
Medication Instructions:  Your physician recommends that you continue on your current medications as directed. Please refer to the Current Medication list given to you today.  *If you need a refill on your cardiac medications before your next appointment, please call your pharmacy*   Lab Work: TODAY: Troponin T, CRP, ESR, D-Dimer    If you have labs (blood work) drawn today and your tests are completely normal, you will receive your results only by: Marland Kitchen MyChart Message (if you have MyChart) OR . A paper copy in the mail If you have any lab test that is abnormal or we need to change your treatment, we will call you to review the results.   Testing/Procedures: Your physician has requested that you have an echocardiogram. Echocardiography is a painless test that uses sound waves to create images of your heart. It provides your doctor with information about the size and shape of your heart and how well your heart's chambers and valves are working. This procedure takes approximately one hour. There are no restrictions for this procedure.  CT Angiography (CTA), is a special type of CT scan that uses a computer to produce multi-dimensional views of major blood vessels throughout the body. In CT angiography, a contrast material is injected through an IV to help visualize the blood vessels    Follow-Up: At [facility], you and your health needs are our priority.  As part of our continuing mission to provide you with exceptional heart care, we have created designated Provider Care Teams.  These Care Teams include your primary Cardiologist (physician) and Advanced Practice Providers (APPs -  Physician Assistants and Nurse Practitioners) who all work together to provide you with the care you need, when you need it.   Your next appointment:   As needed based on results of testing  The format for your next appointment:   In Person  Provider:   Fransico Him, MD

## 2020-10-18 ENCOUNTER — Telehealth: Payer: Self-pay | Admitting: Cardiology

## 2020-10-18 ENCOUNTER — Ambulatory Visit (INDEPENDENT_AMBULATORY_CARE_PROVIDER_SITE_OTHER)
Admission: RE | Admit: 2020-10-18 | Discharge: 2020-10-18 | Disposition: A | Discharge status: Home / Self Care | Payer: BC Managed Care – PPO | Source: Ambulatory Visit | Attending: Cardiology | Admitting: Cardiology

## 2020-10-18 DIAGNOSIS — R072 Precordial pain: Secondary | ICD-10-CM | POA: Diagnosis not present

## 2020-10-18 DIAGNOSIS — R5383 Other fatigue: Secondary | ICD-10-CM | POA: Diagnosis not present

## 2020-10-18 DIAGNOSIS — R079 Chest pain, unspecified: Secondary | ICD-10-CM | POA: Diagnosis not present

## 2020-10-18 MED ORDER — IOHEXOL 350 MG/ML SOLN
80.0000 mL | Freq: Once | INTRAVENOUS | Status: AC | PRN
  Administered 2020-10-18: 80 mL via INTRAVENOUS

## 2020-10-18 NOTE — Telephone Encounter (Signed)
Spoke with the patient - he saw Dr. Mayford Knife yesterday and CTA was ordered. Results are pending. Advised patient that once Dr. Mayford Knife reviews those results we can determine whether he needs to be out of work. Patient verbalized understanding.

## 2020-10-18 NOTE — Telephone Encounter (Signed)
New message:      Pt says he needs a letter for work on Monday(10-21-20) stating that is not ready to return to work, because he is still having chest pains.

## 2020-10-25 DIAGNOSIS — R079 Chest pain, unspecified: Secondary | ICD-10-CM | POA: Diagnosis not present

## 2020-11-06 DIAGNOSIS — M25512 Pain in left shoulder: Secondary | ICD-10-CM | POA: Diagnosis not present

## 2020-11-13 ENCOUNTER — Other Ambulatory Visit (HOSPITAL_COMMUNITY): Payer: BC Managed Care – PPO

## 2020-11-13 ENCOUNTER — Encounter (HOSPITAL_COMMUNITY): Payer: Self-pay | Admitting: Cardiology

## 2020-11-13 NOTE — Progress Notes (Unsigned)
Patient ID: Paul Yates, male   DOB: 03/09/1986, 35 y.o.   MRN: [id number]  Verified appointment "no show" status with Meeka at 4:15.

## 2020-11-14 ENCOUNTER — Encounter (HOSPITAL_COMMUNITY): Payer: Self-pay | Admitting: Cardiology

## 2020-11-28 ENCOUNTER — Telehealth (HOSPITAL_COMMUNITY): Payer: Self-pay | Admitting: Cardiology

## 2020-11-28 NOTE — Telephone Encounter (Signed)
Just an FYI. We have made several attempts to contact this patient including sending a letter to schedule or reschedule their echocardiogram. We will be removing the patient from the echo WQ.   11/13/20 NO SHOWED- MAILED LETTER [facility]    Thank you

## 2021-05-01 IMAGING — CT CT ANGIO CHEST
2 of 9 series · 19 of 46 positions shown · IV contrast (omnipaque)
Comparison: 6014

CLINICAL DATA: Chest pain, increased fatigue since COVID August 2020

EXAM:
CT ANGIOGRAPHY CHEST WITH CONTRAST
TECHNIQUE: Multidetector CT imaging of the chest was performed using the
standard protocol during bolus administration of intravenous
contrast. Multiplanar CT image reconstructions and MIPs were
obtained to evaluate the vascular anatomy.
CONTRAST:  80mL OMNIPAQUE IOHEXOL 350 MG/ML SOLN

[Series 6: thins · axial · 0.63mm/px · z∈[-347,-59]mm · 16 of 326 slices shown]
[im 19/326  lung]
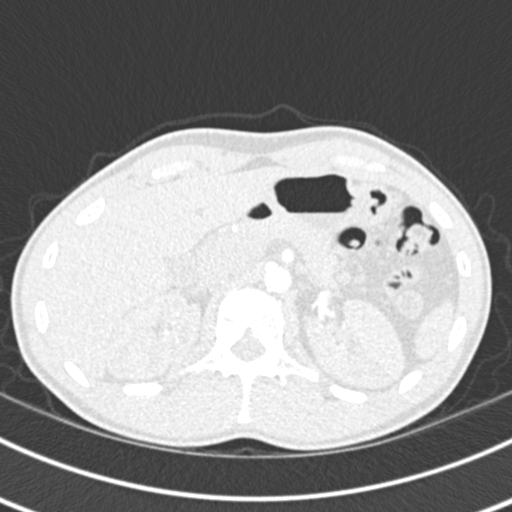
[im 37/326  soft-tissue]
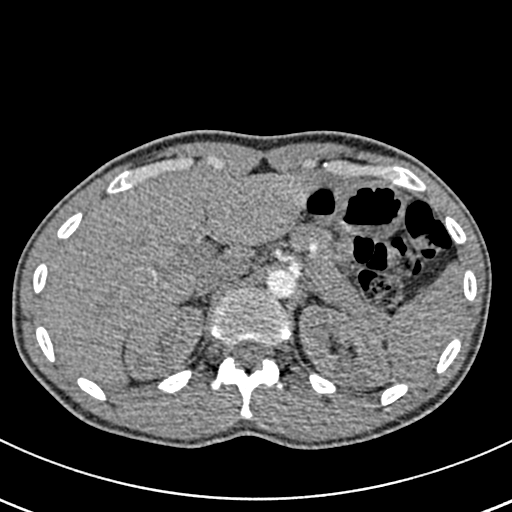
[im 55/326  lung]
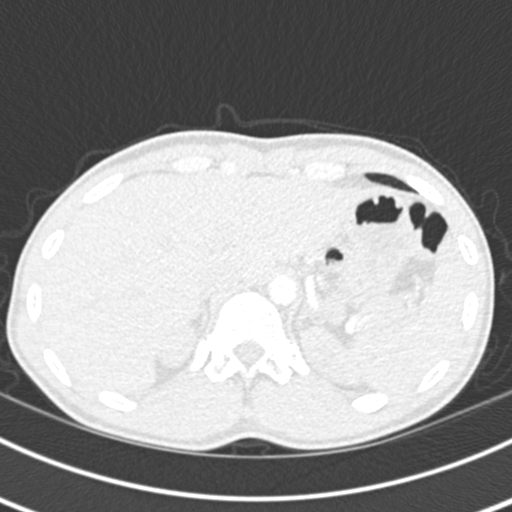
[im 73/326  soft-tissue]
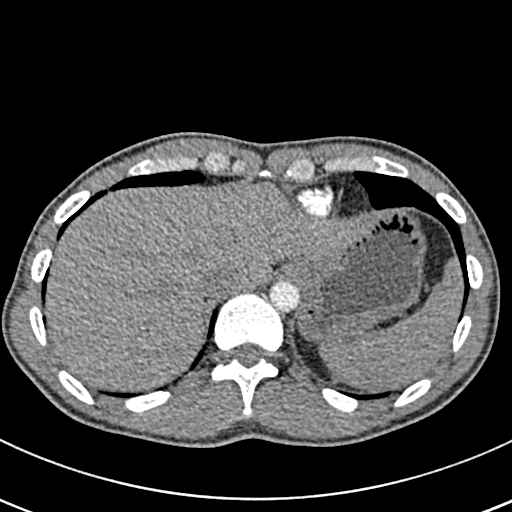
[im 91/326  lung]
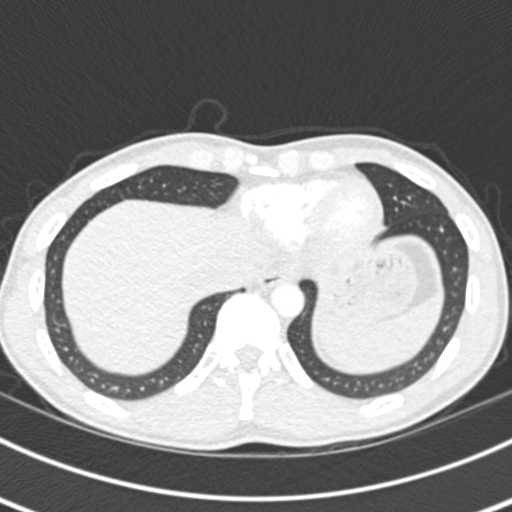
[im 109/326  soft-tissue]
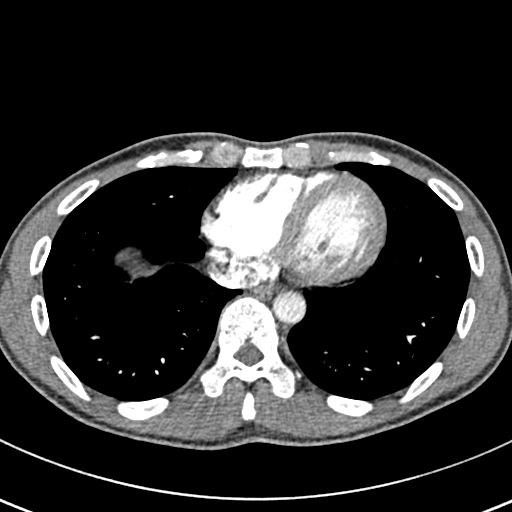
[im 127/326  lung]
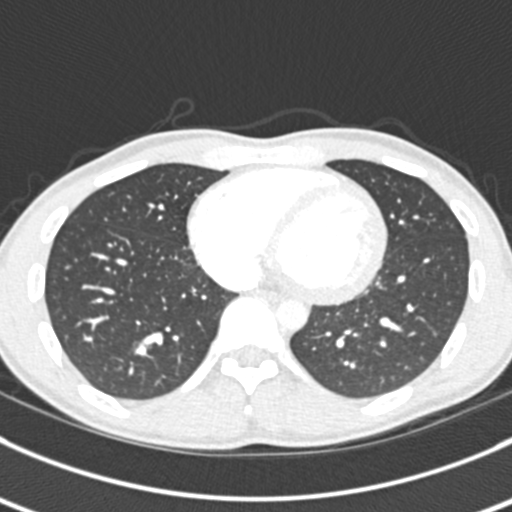
[im 145/326  soft-tissue]
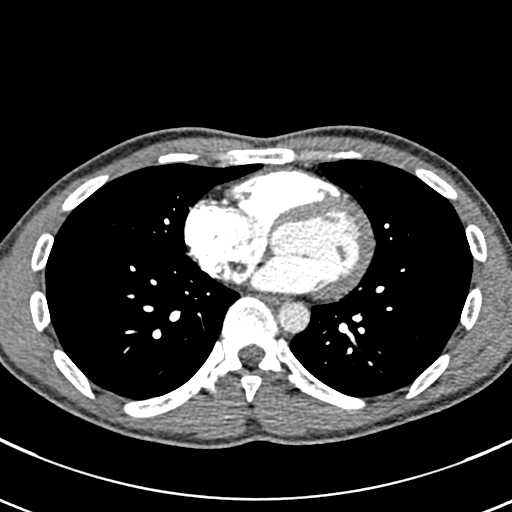
[im 181/326  lung]
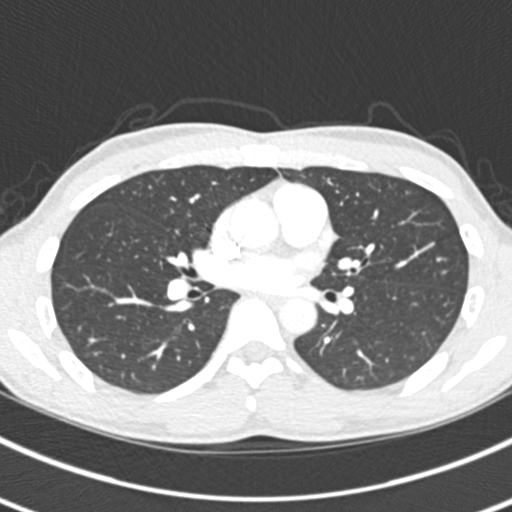
[im 199/326  soft-tissue]
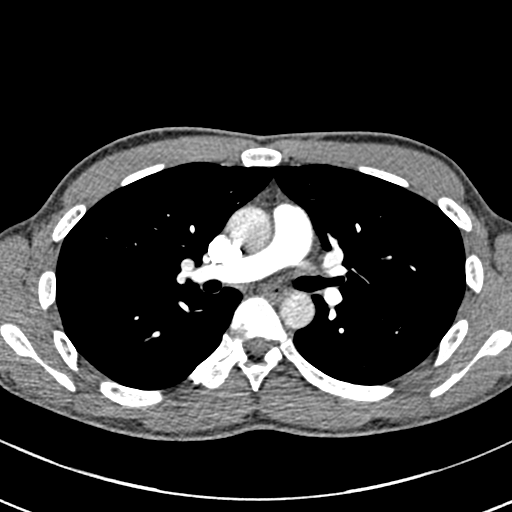
[im 217/326  lung]
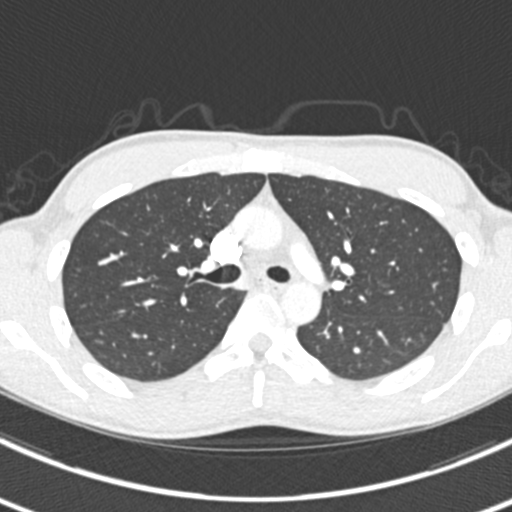
[im 235/326  soft-tissue]
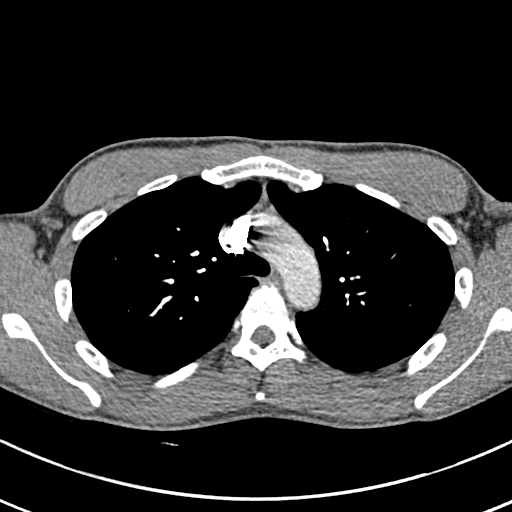
[im 253/326  lung]
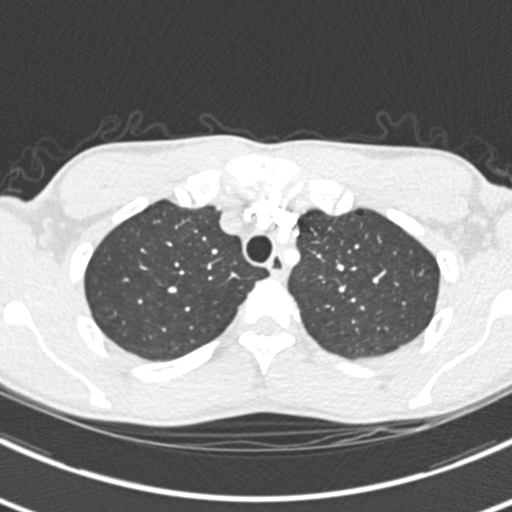
[im 271/326  soft-tissue]
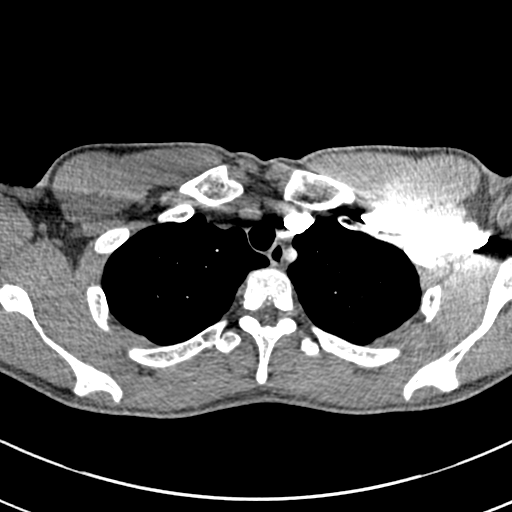
[im 289/326  lung]
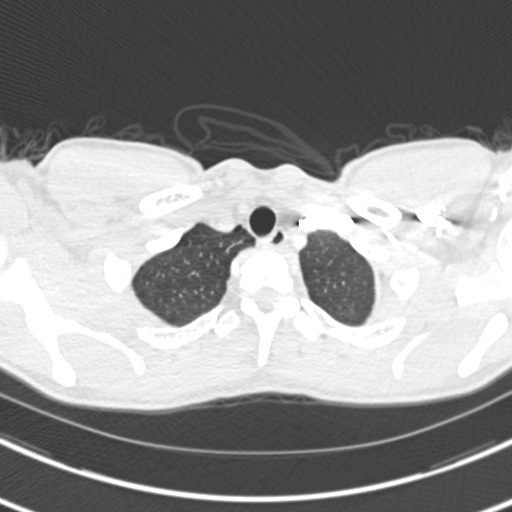
[im 307/326  soft-tissue]
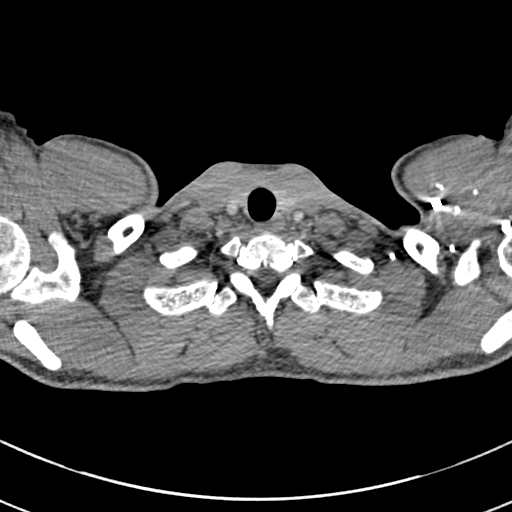

[Series 8: coronal mpr · coronal · 0.62mm/px · 3 of 102 slices shown]
[im 26/102  soft-tissue]
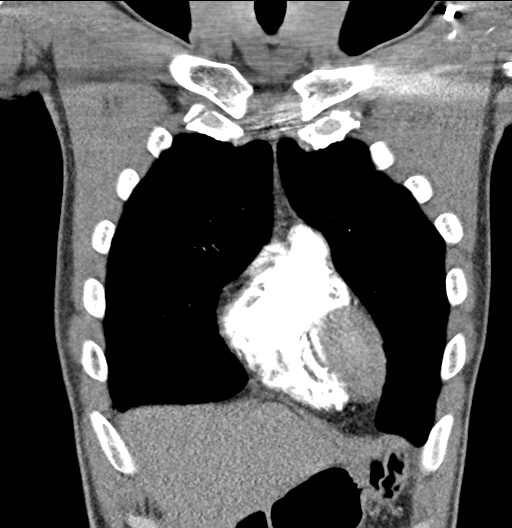
[im 51/102  soft-tissue]
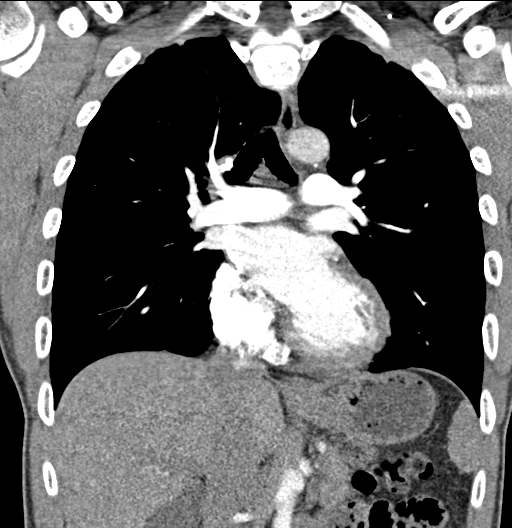
[im 76/102  soft-tissue]
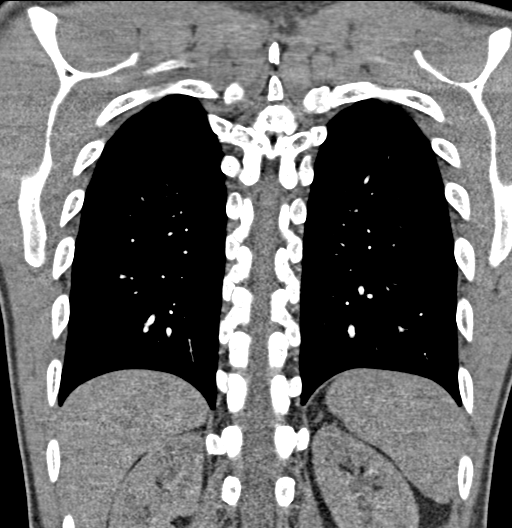

[19 of 46 positions shown; findings below may reference images not displayed]

FINDINGS: Cardiovascular: Satisfactory opacification of the pulmonary arteries
to the segmental level. No evidence of pulmonary embolism. Normal
heart size. No pericardial effusion.

Mediastinum/Nodes: No enlarged lymph nodes. No significant
abnormality.

Lungs/Pleura: No pleural effusion or pneumothorax. No consolidation
or mass. No significant postinflammatory scarring.

Upper Abdomen: No acute abnormality.

Musculoskeletal: No chest wall abnormality. No acute or significant
osseous findings.

Review of the MIP images confirms the above findings.
IMPRESSION: No evidence of acute pulmonary embolism or other acute abnormality.
